# Patient Record
Sex: Female | Born: 1955 | Race: Black or African American | Hispanic: No | Marital: Married | Smoking: Never smoker
Health system: Southern US, Community
[De-identification: ages and names within clinical notes are randomized; demographics above are authoritative.]

## PROBLEM LIST (undated history)

## (undated) DIAGNOSIS — K219 Gastro-esophageal reflux disease without esophagitis: Secondary | ICD-10-CM

## (undated) DIAGNOSIS — E079 Disorder of thyroid, unspecified: Secondary | ICD-10-CM

## (undated) DIAGNOSIS — M1990 Unspecified osteoarthritis, unspecified site: Secondary | ICD-10-CM

## (undated) DIAGNOSIS — I10 Essential (primary) hypertension: Secondary | ICD-10-CM

## (undated) DIAGNOSIS — T7840XA Allergy, unspecified, initial encounter: Secondary | ICD-10-CM

## (undated) DIAGNOSIS — M810 Age-related osteoporosis without current pathological fracture: Secondary | ICD-10-CM

## (undated) DIAGNOSIS — E119 Type 2 diabetes mellitus without complications: Secondary | ICD-10-CM

## (undated) HISTORY — DX: Unspecified osteoarthritis, unspecified site: M19.90

## (undated) HISTORY — PX: CHOLECYSTECTOMY: SHX55

## (undated) HISTORY — DX: Gastro-esophageal reflux disease without esophagitis: K21.9

## (undated) HISTORY — DX: Age-related osteoporosis without current pathological fracture: M81.0

## (undated) HISTORY — DX: Allergy, unspecified, initial encounter: T78.40XA

## (undated) HISTORY — PX: ABDOMINAL HYSTERECTOMY: SHX81

---

## 1996-02-05 HISTORY — PX: PARATHYROIDECTOMY: SHX19

## 2004-01-25 ENCOUNTER — Emergency Department: Payer: Self-pay | Admitting: Internal Medicine

## 2004-09-18 ENCOUNTER — Emergency Department: Payer: Self-pay | Admitting: Emergency Medicine

## 2004-10-26 ENCOUNTER — Emergency Department: Payer: Self-pay | Admitting: Emergency Medicine

## 2005-01-09 ENCOUNTER — Ambulatory Visit: Payer: Self-pay

## 2005-03-24 ENCOUNTER — Other Ambulatory Visit: Payer: Self-pay

## 2005-03-24 ENCOUNTER — Emergency Department: Payer: Self-pay | Admitting: Emergency Medicine

## 2006-03-26 ENCOUNTER — Ambulatory Visit: Payer: Self-pay

## 2006-06-21 ENCOUNTER — Emergency Department: Payer: Self-pay | Admitting: Unknown Physician Specialty

## 2006-08-16 ENCOUNTER — Emergency Department: Payer: Self-pay | Admitting: Unknown Physician Specialty

## 2006-10-02 ENCOUNTER — Ambulatory Visit: Payer: Self-pay | Admitting: Gastroenterology

## 2007-06-02 ENCOUNTER — Ambulatory Visit: Payer: Self-pay

## 2007-07-16 ENCOUNTER — Emergency Department: Payer: Self-pay | Admitting: Emergency Medicine

## 2007-08-13 ENCOUNTER — Emergency Department: Payer: Self-pay | Admitting: Emergency Medicine

## 2008-11-23 ENCOUNTER — Ambulatory Visit: Payer: Self-pay

## 2008-12-02 DIAGNOSIS — I868 Varicose veins of other specified sites: Secondary | ICD-10-CM | POA: Insufficient documentation

## 2009-08-03 ENCOUNTER — Emergency Department: Payer: Self-pay | Admitting: Emergency Medicine

## 2010-01-10 ENCOUNTER — Ambulatory Visit: Payer: Self-pay

## 2011-04-09 ENCOUNTER — Ambulatory Visit: Payer: Self-pay

## 2011-11-20 ENCOUNTER — Ambulatory Visit: Payer: Self-pay | Admitting: Gastroenterology

## 2011-11-22 LAB — PATHOLOGY REPORT

## 2012-08-19 ENCOUNTER — Ambulatory Visit: Payer: Self-pay

## 2013-09-05 ENCOUNTER — Emergency Department: Payer: Self-pay | Admitting: Emergency Medicine

## 2013-10-06 ENCOUNTER — Ambulatory Visit: Payer: Self-pay

## 2013-11-06 ENCOUNTER — Emergency Department: Payer: Self-pay | Admitting: Internal Medicine

## 2014-09-09 ENCOUNTER — Encounter: Payer: Self-pay | Admitting: Emergency Medicine

## 2014-09-09 ENCOUNTER — Other Ambulatory Visit: Payer: Self-pay

## 2014-09-09 ENCOUNTER — Emergency Department: Payer: Medicaid Other

## 2014-09-09 ENCOUNTER — Emergency Department
Admission: EM | Admit: 2014-09-09 | Discharge: 2014-09-09 | Disposition: A | Discharge status: Home / Self Care | Payer: Medicaid Other | Attending: Emergency Medicine | Admitting: Emergency Medicine

## 2014-09-09 DIAGNOSIS — E119 Type 2 diabetes mellitus without complications: Secondary | ICD-10-CM | POA: Insufficient documentation

## 2014-09-09 DIAGNOSIS — R42 Dizziness and giddiness: Secondary | ICD-10-CM

## 2014-09-09 DIAGNOSIS — I10 Essential (primary) hypertension: Secondary | ICD-10-CM | POA: Insufficient documentation

## 2014-09-09 DIAGNOSIS — R110 Nausea: Secondary | ICD-10-CM | POA: Diagnosis not present

## 2014-09-09 DIAGNOSIS — H6122 Impacted cerumen, left ear: Secondary | ICD-10-CM | POA: Diagnosis not present

## 2014-09-09 HISTORY — DX: Type 2 diabetes mellitus without complications: E11.9

## 2014-09-09 HISTORY — DX: Disorder of thyroid, unspecified: E07.9

## 2014-09-09 HISTORY — DX: Essential (primary) hypertension: I10

## 2014-09-09 LAB — CBC WITH DIFFERENTIAL/PLATELET
BASOS ABS: 0 10*3/uL (ref 0–0.1)
Basophils Relative: 1 %
EOS ABS: 0.1 10*3/uL (ref 0–0.7)
Eosinophils Relative: 3 %
HEMATOCRIT: 34.4 % — AB (ref 35.0–47.0)
Hemoglobin: 11 g/dL — ABNORMAL LOW (ref 12.0–16.0)
LYMPHS PCT: 23 %
Lymphs Abs: 1.1 10*3/uL (ref 1.0–3.6)
MCH: 25.8 pg — AB (ref 26.0–34.0)
MCHC: 31.9 g/dL — ABNORMAL LOW (ref 32.0–36.0)
MCV: 80.9 fL (ref 80.0–100.0)
MONOS PCT: 9 %
Monocytes Absolute: 0.4 10*3/uL (ref 0.2–0.9)
Neutro Abs: 3.3 10*3/uL (ref 1.4–6.5)
Neutrophils Relative %: 64 %
Platelets: 158 10*3/uL (ref 150–440)
RBC: 4.25 MIL/uL (ref 3.80–5.20)
RDW: 16.5 % — ABNORMAL HIGH (ref 11.5–14.5)
WBC: 5 10*3/uL (ref 3.6–11.0)

## 2014-09-09 LAB — COMPREHENSIVE METABOLIC PANEL
ALBUMIN: 4.1 g/dL (ref 3.5–5.0)
ALK PHOS: 65 U/L (ref 38–126)
ALT: 15 U/L (ref 14–54)
AST: 20 U/L (ref 15–41)
Anion gap: 9 (ref 5–15)
BILIRUBIN TOTAL: 0.2 mg/dL — AB (ref 0.3–1.2)
BUN: 23 mg/dL — ABNORMAL HIGH (ref 6–20)
CO2: 27 mmol/L (ref 22–32)
Calcium: 9.6 mg/dL (ref 8.9–10.3)
Chloride: 103 mmol/L (ref 101–111)
Creatinine, Ser: 0.8 mg/dL (ref 0.44–1.00)
GFR calc Af Amer: >60 mL/min (ref >60)
GFR calc non Af Amer: >60 mL/min (ref >60)
Glucose, Bld: 126 mg/dL — ABNORMAL HIGH (ref 65–99)
Potassium: 3.8 mmol/L (ref 3.5–5.1)
Sodium: 139 mmol/L (ref 135–145)
Total Protein: 7.9 g/dL (ref 6.5–8.1)

## 2014-09-09 LAB — URINALYSIS COMPLETE WITH MICROSCOPIC (ARMC ONLY)
Bilirubin Urine: NEGATIVE
GLUCOSE, UA: NEGATIVE mg/dL
Hgb urine dipstick: NEGATIVE
Ketones, ur: NEGATIVE mg/dL
Nitrite: NEGATIVE
Protein, ur: NEGATIVE mg/dL
Specific Gravity, Urine: 1.009 (ref 1.005–1.030)
pH: 6 (ref 5.0–8.0)

## 2014-09-09 LAB — TROPONIN I

## 2014-09-09 MED ORDER — ONDANSETRON 4 MG PO TBDP
4.0000 mg | ORAL_TABLET | Freq: Once | ORAL | Status: AC
  Administered 2014-09-09: 4 mg via ORAL
  Filled 2014-09-09: qty 1

## 2014-09-09 MED ORDER — MECLIZINE HCL 25 MG PO TABS
25.0000 mg | ORAL_TABLET | Freq: Once | ORAL | Status: AC
  Administered 2014-09-09: 25 mg via ORAL
  Filled 2014-09-09: qty 1

## 2014-09-09 MED ORDER — SODIUM CHLORIDE 0.9 % IR SOLN: Freq: Once | Status: DC

## 2014-09-09 MED ORDER — HYDROGEN PEROXIDE 3 % EX SOLN
Freq: Once | CUTANEOUS | Status: AC
  Administered 2014-09-09: 11:00:00 via TOPICAL
  Filled 2014-09-09: qty 473

## 2014-09-09 MED ORDER — MECLIZINE HCL 25 MG PO TABS
25.0000 mg | ORAL_TABLET | Freq: Three times a day (TID) | ORAL | Status: AC | PRN
Start: 2014-09-09 — End: 2015-09-09

## 2014-09-09 NOTE — ED Notes (Signed)
Pt c/o dizziness since 0400 today. C/o ears feeling full. No c/o syncope.

## 2014-09-09 NOTE — Discharge Instructions (Signed)

## 2014-09-09 NOTE — ED Provider Notes (Signed)
[facility] Emergency Department Provider Note     Time seen: ----------------------------------------- 7:08 AM on 09/09/2014 -----------------------------------------    I have reviewed the triage vital signs and the nursing notes.   HISTORY  Chief Complaint No chief complaint on file.    HPI Lauren Walton is a 59 y.o. female presents ER for dizziness. By dizziness she means lightheadedness. She states she took a her daughters vertigo medication with mild improvement. She's had slight nausea. Symptoms started at 4:00 this morning. She had this happen years ago, denies fevers chills, recent illness. Denies any change in her medications, she states she's been eating and drinking properly. Denies black stools or tarry stools   Past Medical History  Diagnosis Date  . Diabetes mellitus without complication   . Hypertension   . Thyroid disease     There are no active problems to display for this patient.   Past Surgical History  Procedure Laterality Date  . Abdominal hysterectomy    . Cholecystectomy      Allergies Ace inhibitors  Social History History  Substance Use Topics  . Smoking status: Never Smoker   . Smokeless tobacco: Not on file  . Alcohol Use: No    Review of Systems Constitutional: Negative for fever. Eyes: Negative for visual changes. ENT: Negative for sore throat. Cardiovascular: Negative for chest pain. Respiratory: Negative for shortness of breath. Gastrointestinal: Negative for abdominal pain, positive for nausea Genitourinary: Negative for dysuria. Musculoskeletal: Negative for back pain. Skin: Negative for rash. Neurological: Positive for dizziness  10-point ROS otherwise negative.  ____________________________________________   PHYSICAL EXAM:  VITAL SIGNS: ED Triage Vitals  Enc Vitals Group     BP --      Pulse --      Resp --      Temp --      Temp src --      SpO2 --      Weight --      Height  --      Head Cir --      Peak Flow --      Pain Score --      Pain Loc --      Pain Edu? --      Excl. in GC? --     Constitutional: Alert and oriented. Well appearing and in no distress. Eyes: Conjunctivae are normal. PERRL. Normal extraocular movements. ENT   Head: Normocephalic and atraumatic.   Nose: No congestion/rhinnorhea.   Mouth/Throat: Mucous membranes are moist.   Neck: No stridor.      Ears: There is cerumen impaction on the left Cardiovascular: Normal rate, regular rhythm. Normal and symmetric distal pulses are present in all extremities. No murmurs, rubs, or gallops. Respiratory: Normal respiratory effort without tachypnea nor retractions. Breath sounds are clear and equal bilaterally. No wheezes/rales/rhonchi. Gastrointestinal: Soft and nontender. No distention. No abdominal bruits.  Musculoskeletal: Nontender with normal range of motion in all extremities. No joint effusions.  No lower extremity tenderness nor edema. Neurologic:  Normal speech and language. No gross focal neurologic deficits are appreciated. Speech is normal. No gait instability. Skin:  Skin is warm, dry and intact. No rash noted. Psychiatric: Mood and affect are normal. Speech and behavior are normal. Patient exhibits appropriate insight and judgment. ____________________________________________  EKG: Interpreted by me. Normal sinus rhythm , with normal axis, rate is 72 bpm,  no evidence of acute infarction  ____________________________________________  ED COURSE:  Pertinent labs & imaging results that were available during my care of the patient were reviewed by me and considered in my medical decision making (see chart for details). Patient with nonspecific dizziness, will need general evaluation ____________________________________________     LABS (pertinent positives/negatives)  Labs Reviewed  CBC WITH DIFFERENTIAL/PLATELET - Abnormal; Notable for the following:    Hemoglobin 11.0 (*)    HCT 34.4 (*)    MCH 25.8 (*)    MCHC 31.9 (*)    RDW 16.5 (*)    All other components within normal limits  COMPREHENSIVE METABOLIC PANEL - Abnormal; Notable for the following:    Glucose, Bld 126 (*)    BUN 23 (*)    Total Bilirubin 0.2 (*)    All other components within normal limits  URINALYSIS COMPLETEWITH MICROSCOPIC ([facility] ONLY) - Abnormal; Notable for the following:    Color, Urine STRAW (*)    APPearance CLEAR (*)    Leukocytes, UA 1+ (*)    Bacteria, UA RARE (*)    Squamous Epithelial / LPF 0-5 (*)    All other components within normal limits  TROPONIN I    RADIOLOGY  CT head is unremarkable  ____________________________________________  FINAL ASSESSMENT AND PLAN  Dizziness  Plan: Patient with labs and imaging as dictated above. She is in no acute distress, no clear cause for her lightheadedness that she describes as dizziness. Labs are unremarkable. Symptoms seemed to improve for meclizine. I will prescribe meclizine and have her follow-up with her doctor.   Emily Filbert, MD   Emily Filbert, MD 09/09/14 [phone]

## 2014-09-29 ENCOUNTER — Other Ambulatory Visit: Payer: Self-pay | Admitting: Internal Medicine

## 2014-09-29 DIAGNOSIS — Z1231 Encounter for screening mammogram for malignant neoplasm of breast: Secondary | ICD-10-CM

## 2014-10-11 ENCOUNTER — Ambulatory Visit
Admission: RE | Admit: 2014-10-11 | Discharge: 2014-10-11 | Disposition: A | Discharge status: Home / Self Care | Payer: Medicaid Other | Source: Ambulatory Visit | Attending: Internal Medicine | Admitting: Internal Medicine

## 2014-10-11 DIAGNOSIS — R928 Other abnormal and inconclusive findings on diagnostic imaging of breast: Secondary | ICD-10-CM | POA: Diagnosis not present

## 2014-10-11 DIAGNOSIS — Z1231 Encounter for screening mammogram for malignant neoplasm of breast: Secondary | ICD-10-CM

## 2014-10-13 ENCOUNTER — Ambulatory Visit
Admission: RE | Admit: 2014-10-13 | Discharge: 2014-10-13 | Disposition: A | Discharge status: Home / Self Care | Payer: Medicaid Other | Source: Ambulatory Visit | Attending: Internal Medicine | Admitting: Internal Medicine

## 2014-10-13 ENCOUNTER — Other Ambulatory Visit: Payer: Self-pay | Admitting: Internal Medicine

## 2014-10-13 DIAGNOSIS — N63 Unspecified lump in unspecified breast: Secondary | ICD-10-CM

## 2015-04-05 ENCOUNTER — Emergency Department: Payer: Medicaid Other

## 2015-04-05 ENCOUNTER — Emergency Department
Admission: EM | Admit: 2015-04-05 | Discharge: 2015-04-06 | Disposition: A | Discharge status: Home / Self Care | Payer: Medicaid Other | Attending: Emergency Medicine | Admitting: Emergency Medicine

## 2015-04-05 DIAGNOSIS — I10 Essential (primary) hypertension: Secondary | ICD-10-CM | POA: Insufficient documentation

## 2015-04-05 DIAGNOSIS — R002 Palpitations: Secondary | ICD-10-CM | POA: Diagnosis present

## 2015-04-05 DIAGNOSIS — E119 Type 2 diabetes mellitus without complications: Secondary | ICD-10-CM | POA: Diagnosis not present

## 2015-04-05 DIAGNOSIS — Z7982 Long term (current) use of aspirin: Secondary | ICD-10-CM | POA: Diagnosis not present

## 2015-04-05 DIAGNOSIS — K5900 Constipation, unspecified: Secondary | ICD-10-CM | POA: Diagnosis not present

## 2015-04-05 DIAGNOSIS — Z79899 Other long term (current) drug therapy: Secondary | ICD-10-CM | POA: Insufficient documentation

## 2015-04-05 DIAGNOSIS — Z7984 Long term (current) use of oral hypoglycemic drugs: Secondary | ICD-10-CM | POA: Diagnosis not present

## 2015-04-05 LAB — COMPREHENSIVE METABOLIC PANEL
ALT: 14 U/L (ref 14–54)
AST: 20 U/L (ref 15–41)
Albumin: 4.1 g/dL (ref 3.5–5.0)
Alkaline Phosphatase: 69 U/L (ref 38–126)
Anion gap: 8 (ref 5–15)
BUN: 25 mg/dL — AB (ref 6–20)
CHLORIDE: 105 mmol/L (ref 101–111)
CO2: 26 mmol/L (ref 22–32)
CREATININE: 0.91 mg/dL (ref 0.44–1.00)
Calcium: 9.7 mg/dL (ref 8.9–10.3)
GLUCOSE: 116 mg/dL — AB (ref 65–99)
POTASSIUM: 3.8 mmol/L (ref 3.5–5.1)
Sodium: 139 mmol/L (ref 135–145)
TOTAL PROTEIN: 8 g/dL (ref 6.5–8.1)
Total Bilirubin: 0.4 mg/dL (ref 0.3–1.2)

## 2015-04-05 LAB — CBC
HCT: 33.5 % — ABNORMAL LOW (ref 35.0–47.0)
Hemoglobin: 10.8 g/dL — ABNORMAL LOW (ref 12.0–16.0)
MCH: 25.9 pg — AB (ref 26.0–34.0)
MCHC: 32.3 g/dL (ref 32.0–36.0)
MCV: 80.1 fL (ref 80.0–100.0)
PLATELETS: 194 10*3/uL (ref 150–440)
RBC: 4.19 MIL/uL (ref 3.80–5.20)
RDW: 16.4 % — AB (ref 11.5–14.5)
WBC: 6.9 10*3/uL (ref 3.6–11.0)

## 2015-04-05 LAB — TROPONIN I

## 2015-04-05 NOTE — ED Notes (Signed)
Pt to Xray.

## 2015-04-05 NOTE — ED Notes (Signed)
Onset yesterday of palpitations after sitting on the toilet (constipation). Patient denies CP, respiratory distress or shortness of breath, denies N/V or diaphoresis. Able to speak without difficulty. States when she walks it goes away but when she is at rest it starts again. States it is just every once in a while not all the time. Skin warm/dry, color normal for ethnicity.

## 2015-04-05 NOTE — Discharge Instructions (Signed)
Cardiac Event Monitoring °A cardiac event monitor is a small recording device used to help detect abnormal heart rhythms (arrhythmias). The monitor is used to record heart rhythm when noticeable symptoms such as the following occur: °· Fast heartbeats (palpitations), such as heart racing or fluttering. °· Dizziness. °· Fainting or light-headedness. °· Unexplained weakness. °The monitor is wired to two electrodes placed on your chest. Electrodes are flat, sticky disks that attach to your skin. The monitor can be worn for up to 30 days. You will wear the monitor at all times, except when bathing.  °HOW TO USE YOUR CARDIAC EVENT MONITOR °A technician will prepare your chest for the electrode placement. The technician will show you how to place the electrodes, how to work the monitor, and how to replace the batteries. Take time to practice using the monitor before you leave the office. Make sure you understand how to send the information from the monitor to your health care provider. This requires a telephone with a landline, not a cell phone. You need to: °· Wear your monitor at all times, except when you are in water: °· Do not get the monitor wet. °· Take the monitor off when bathing. Do not swim or use a hot tub with it on. °· Keep your skin clean. Do not put body lotion or moisturizer on your chest. °· Change the electrodes daily or any time they stop sticking to your skin. You might need to use tape to keep them on. °· It is possible that your skin under the electrodes could become irritated. To keep this from happening, try to put the electrodes in slightly different places on your chest. However, they must remain in the area under your left breast and in the upper right section of your chest. °· Make sure the monitor is safely clipped to your clothing or in a location close to your body that your health care provider recommends. °· Press the button to record when you feel symptoms of heart trouble, such as  dizziness, weakness, light-headedness, palpitations, thumping, shortness of breath, unexplained weakness, or a fluttering or racing heart. The monitor is always on and records what happened slightly before you pressed the button, so do not worry about being too late to get good information. °· Keep a diary of your activities, such as walking, doing chores, and taking medicine. It is especially important to note what you were doing when you pushed the button to record your symptoms. This will help your health care provider determine what might be contributing to your symptoms. The information stored in your monitor will be reviewed by your health care provider alongside your diary entries. °· Send the recorded information as recommended by your health care provider. It is important to understand that it will take some time for your health care provider to process the results. °· Change the batteries as recommended by your health care provider. °SEEK IMMEDIATE MEDICAL CARE IF:  °· You have chest pain. °· You have extreme difficulty breathing or shortness of breath. °· You develop a very fast heartbeat that persists. °· You develop dizziness that does not go away. °· You faint or constantly feel you are about to faint. °  °This information is not intended to replace advice given to you by your health care provider. Make sure you discuss any questions you have with your health care provider. °  °Document Released: 10/31/2007 Document Revised: 02/11/2014 Document Reviewed: 07/20/2012 °Elsevier Interactive Patient Education ©2016 [employer] ° °  Constipation, Adult Constipation is when a person:  Poops (has a bowel movement) less than 3 times a week.  Has a hard time pooping.  Has poop that is dry, hard, or bigger than normal. HOME CARE   Eat foods with a lot of fiber in them. This includes fruits, vegetables, beans, and whole grains such as brown rice.  Avoid fatty foods and foods with a lot of sugar. This  includes french fries, hamburgers, cookies, candy, and soda.  If you are not getting enough fiber from food, take products with added fiber in them (supplements).  Drink enough fluid to keep your pee (urine) clear or pale yellow.  Exercise on a regular basis, or as told by your doctor.  Go to the restroom when you feel like you need to poop. Do not hold it.  Only take medicine as told by your doctor. Do not take medicines that help you poop (laxatives) without talking to your doctor first. GET HELP RIGHT AWAY IF:   You have bright red blood in your poop (stool).  Your constipation lasts more than 4 days or gets worse.  You have belly (abdominal) or butt (rectal) pain.  You have thin poop (as thin as a pencil).  You lose weight, and it cannot be explained. MAKE SURE YOU:   Understand these instructions.  Will watch your condition.  Will get help right away if you are not doing well or get worse.   This information is not intended to replace advice given to you by your health care provider. Make sure you discuss any questions you have with your health care provider.   Document Released: 07/10/2007 Document Revised: 02/11/2014 Document Reviewed: 11/02/2012 Elsevier Interactive Patient Education 2016 [employer].  Palpitations A palpitation is the feeling that your heartbeat is irregular or is faster than normal. It may feel like your heart is fluttering or skipping a beat. Palpitations are usually not a serious problem. However, in some cases, you may need further medical evaluation. CAUSES  Palpitations can be caused by:  Smoking.  Caffeine or other stimulants, such as diet pills or energy drinks.  Alcohol.  Stress and anxiety.  Strenuous physical activity.  Fatigue.  Certain medicines.  Heart disease, especially if you have a history of irregular heart rhythms (arrhythmias), such as atrial fibrillation, atrial flutter, or supraventricular tachycardia.  An  improperly working pacemaker or defibrillator. DIAGNOSIS  To find the cause of your palpitations, your health care provider will take your medical history and perform a physical exam. Your health care provider may also have you take a test called an ambulatory electrocardiogram (ECG). An ECG records your heartbeat patterns over a 24-hour period. You may also have other tests, such as:  Transthoracic echocardiogram (TTE). During echocardiography, sound waves are used to evaluate how blood flows through your heart.  Transesophageal echocardiogram (TEE).  Cardiac monitoring. This allows your health care provider to monitor your heart rate and rhythm in real time.  Holter monitor. This is a portable device that records your heartbeat and can help diagnose heart arrhythmias. It allows your health care provider to track your heart activity for several days, if needed.  Stress tests by exercise or by giving medicine that makes the heart beat faster. TREATMENT  Treatment of palpitations depends on the cause of your symptoms and can vary greatly. Most cases of palpitations do not require any treatment other than time, relaxation, and monitoring your symptoms. Other causes, such as atrial fibrillation, atrial flutter, or  supraventricular tachycardia, usually require further treatment. HOME CARE INSTRUCTIONS   Avoid:  Caffeinated coffee, tea, soft drinks, diet pills, and energy drinks.  Chocolate.  Alcohol.  Stop smoking if you smoke.  Reduce your stress and anxiety. Things that can help you relax include:  A method of controlling things in your body, such as your heartbeats, with your mind (biofeedback).  Yoga.  Meditation.  Physical activity such as swimming, jogging, or walking.  Get plenty of rest and sleep. SEEK MEDICAL CARE IF:   You continue to have a fast or irregular heartbeat beyond 24 hours.  Your palpitations occur more often. SEEK IMMEDIATE MEDICAL CARE IF:  You have  chest pain or shortness of breath.  You have a severe headache.  You feel dizzy or you faint. MAKE SURE YOU:  Understand these instructions.  Will watch your condition.  Will get help right away if you are not doing well or get worse.   This information is not intended to replace advice given to you by your health care provider. Make sure you discuss any questions you have with your health care provider.   Document Released: 01/19/2000 Document Revised: 01/26/2013 Document Reviewed: 03/22/2011 Elsevier Interactive Patient Education [employer]   Please return immediately if condition worsens. Please contact her primary physician or the physician you were given for referral. If you have any specialist physicians involved in her treatment and plan please also contact them. Thank you for using [facility]. Consider magnesium citrate for constipation. Discussed with your primary physician and event monitor or Holter monitor.

## 2015-04-06 NOTE — ED Provider Notes (Signed)
Time Seen: Approximately 2320 I have reviewed the triage notes  Chief Complaint: Palpitations   History of Present Illness: Lauren Walton is a 60 y.o. female *who presents with feelings of heart palpitations after straining on the toilet because of a history of constipation. States she's been constipated now for the last 2 weeks and is noticed some intermittent heart palpitations generally when she is straining to have a bowel movement. She states he seemed to be worse tonight, hence her arrival here to emergency department. She denies any associated chest pain, shortness of breath, diaphoresis, syncope or feelings of lightheadedness or any other new concerns. She denies any melena or hematochezia with her bowel movements. She states she has had a colonoscopy in the past and is scheduled for one next year. He denies any weight loss or night sweats. She states her stools are generally small and hard in the last one was this morning. Patient states she was just recently seen by her primary physician for the constipation but did not mention the heart palpitations.   Past Medical History  Diagnosis Date  . Diabetes mellitus without complication (HCC)   . Hypertension   . Thyroid disease     There are no active problems to display for this patient.   Past Surgical History  Procedure Laterality Date  . Abdominal hysterectomy    . Cholecystectomy      Past Surgical History  Procedure Laterality Date  . Abdominal hysterectomy    . Cholecystectomy      Current Outpatient Rx  Name  Route  Sig  Dispense  Refill  . acetaminophen (TYLENOL) 650 MG CR tablet   Oral   Take 650 mg by mouth every 8 (eight) hours as needed for pain.         Marland Kitchen aspirin EC 81 MG tablet   Oral   Take 81 mg by mouth daily.         . hydrochlorothiazide (HYDRODIURIL) 25 MG tablet   Oral   Take 25 mg by mouth daily.         Marland Kitchen levothyroxine (SYNTHROID, LEVOTHROID) 175 MCG tablet   Oral   Take 175 mcg by  mouth daily before breakfast.         . losartan (COZAAR) 25 MG tablet   Oral   Take 25 mg by mouth daily.         . meclizine (ANTIVERT) 25 MG tablet   Oral   Take 1 tablet (25 mg total) by mouth 3 (three) times daily as needed for dizziness or nausea.   30 tablet   1   . metFORMIN (GLUCOPHAGE) 500 MG tablet   Oral   Take by mouth 2 (two) times daily with a meal.         . metoprolol tartrate (LOPRESSOR) 25 MG tablet   Oral   Take 25 mg by mouth 2 (two) times daily.         . simvastatin (ZOCOR) 20 MG tablet   Oral   Take 20 mg by mouth daily.         . Vitamin D, Ergocalciferol, (DRISDOL) 50000 UNITS CAPS capsule   Oral   Take 50,000 Units by mouth every 7 (seven) days.           Allergies:  Ace inhibitors  Family History: No family history on file.  Social History: Social History  Substance Use Topics  . Smoking status: Never Smoker   . Smokeless tobacco:  None  . Alcohol Use: No     Review of Systems:   10 point review of systems was performed and was otherwise negative:  Constitutional: No fever Eyes: No visual disturbances ENT: No sore throat, ear pain Cardiac: No chest pain Respiratory: No shortness of breath, wheezing, or stridor Abdomen: No abdominal pain, no vomiting, No diarrhea Endocrine: No weight loss, No night sweats Extremities: No peripheral edema, cyanosis Skin: No rashes, easy bruising Neurologic: No focal weakness, trouble with speech or swollowing Urologic: No dysuria, Hematuria, or urinary frequency   Physical Exam:  ED Triage Vitals  Enc Vitals Group     BP 04/05/15 2208 131/53 mmHg     Pulse Rate 04/05/15 2208 86     Resp 04/05/15 2208 18     Temp 04/05/15 2208 97.6 F (36.4 C)     Temp Source 04/05/15 2208 Oral     SpO2 04/05/15 2208 99 %     Weight 04/05/15 2208 276 lb (125.193 kg)     Height 04/05/15 2208 5\' 6"  (1.676 m)     Head Cir --      Peak Flow --      Pain Score 04/05/15 2206 0     Pain Loc --       Pain Edu? --      Excl. in GC? --     General: Awake , Alert , and Oriented times 3; GCS 15 Head: Normal cephalic , atraumatic Eyes: Pupils equal , round, reactive to light Nose/Throat: No nasal drainage, patent upper airway without erythema or exudate.  Neck: Supple, Full range of motion, No anterior adenopathy or palpable thyroid masses Lungs: Clear to ascultation without wheezes , rhonchi, or rales Heart: Regular rate, regular rhythm without murmurs , gallops , or rubs Abdomen: Obese, Soft, non tender without rebound, guarding , or rigidity; bowel sounds positive and symmetric in all 4 quadrants. No organomegaly .        Extremities: 2 plus symmetric pulses. No edema, clubbing or cyanosis Neurologic: normal ambulation, Motor symmetric without deficits, sensory intact Skin: warm, dry, no rashes   Labs:   All laboratory work was reviewed including any pertinent negatives or positives listed below:  Labs Reviewed  CBC - Abnormal; Notable for the following:    Hemoglobin 10.8 (*)    HCT 33.5 (*)    MCH 25.9 (*)    RDW 16.4 (*)    All other components within normal limits  COMPREHENSIVE METABOLIC PANEL - Abnormal; Notable for the following:    Glucose, Bld 116 (*)    BUN 25 (*)    All other components within normal limits  TROPONIN I   review laboratory work shows no significant findings  EKG: * ED ECG REPORT I, Jennye Moccasin, the attending physician, personally viewed and interpreted this ECG.  Date: 04/06/2015 EKG Time: 2207 Rate: *84 Rhythm: normal sinus rhythm QRS Axis: Low voltage QRS Intervals: normal ST/T Wave abnormalities: normal Conduction Disturbances: none Narrative Interpretation: unremarkable Poor R-wave progression in the anterior leads No ectopy or ischemic changes  Radiology:  CLINICAL DATA: Onset yesterday of palpitations after sitting on the toilet (constipation). Patient denies CP, respiratory distress or shortness of breath, denies N/V  or diaphoresis. Able to speak without difficulty. States when she walks it goes away but when she is at rest it starts again. States it is just every once in a while not all the time. Hx of HTN  EXAM: CHEST 2 VIEW  COMPARISON:  10/27/2004  FINDINGS: The heart size and mediastinal contours are within normal limits. Both lungs are clear. No pleural effusion or pneumothorax. The visualized skeletal structures are unremarkable.  IMPRESSION: No active cardiopulmonary disease.   Electronically Signed By: Amie Portland M.D. On: 04/05/2015 22:35   I personally reviewed the radiologic studies     ED Course:  Patient's stay here was uneventful and no ectopy was noted on a cardiac monitor. She may be slightly dehydrated and she was advised drink plenty of fluids and was given instructions on constipation. I felt her palpitations were not secondary to a life-threatening arrhythmia and advised her to follow up with her primary physician for possible outpatient cardiac monitoring. She was advised to continue with instructions on constipation with milk of magnesia or magnesium citrate. She was advised to return here especially if she has associated chest pain, shortness of breath, diaphoresis, near syncope or any other new concerns.  Assessment:  Constipation Palpitations   Final Clinical Impression:   Final diagnoses:  Palpitations  Constipation, unspecified constipation type     Plan:  Outpatient management Patient was advised to return immediately if condition worsens. Patient was advised to follow up with their primary care physician or other specialized physicians involved in their outpatient care            Jennye Moccasin, MD 04/06/15 0011

## 2015-04-07 ENCOUNTER — Other Ambulatory Visit: Payer: Self-pay | Admitting: *Deleted

## 2015-04-07 DIAGNOSIS — R002 Palpitations: Secondary | ICD-10-CM

## 2015-04-14 ENCOUNTER — Encounter (INDEPENDENT_AMBULATORY_CARE_PROVIDER_SITE_OTHER): Payer: Medicaid Other

## 2015-04-14 DIAGNOSIS — R002 Palpitations: Secondary | ICD-10-CM

## 2015-04-20 ENCOUNTER — Other Ambulatory Visit: Payer: Self-pay | Admitting: Cardiovascular Disease

## 2015-04-20 ENCOUNTER — Ambulatory Visit
Admission: RE | Admit: 2015-04-20 | Discharge: 2015-04-20 | Disposition: A | Discharge status: Home / Self Care | Payer: Medicaid Other | Source: Ambulatory Visit | Attending: Cardiovascular Disease | Admitting: Cardiovascular Disease

## 2015-04-20 DIAGNOSIS — R002 Palpitations: Secondary | ICD-10-CM

## 2015-11-06 ENCOUNTER — Emergency Department
Admission: EM | Admit: 2015-11-06 | Discharge: 2015-11-06 | Disposition: A | Discharge status: Home / Self Care | Payer: Medicaid Other | Attending: Emergency Medicine | Admitting: Emergency Medicine

## 2015-11-06 ENCOUNTER — Encounter: Payer: Self-pay | Admitting: *Deleted

## 2015-11-06 DIAGNOSIS — K648 Other hemorrhoids: Secondary | ICD-10-CM | POA: Insufficient documentation

## 2015-11-06 DIAGNOSIS — Z7984 Long term (current) use of oral hypoglycemic drugs: Secondary | ICD-10-CM | POA: Insufficient documentation

## 2015-11-06 DIAGNOSIS — E119 Type 2 diabetes mellitus without complications: Secondary | ICD-10-CM | POA: Diagnosis not present

## 2015-11-06 DIAGNOSIS — Z791 Long term (current) use of non-steroidal anti-inflammatories (NSAID): Secondary | ICD-10-CM | POA: Insufficient documentation

## 2015-11-06 DIAGNOSIS — K5900 Constipation, unspecified: Secondary | ICD-10-CM | POA: Diagnosis not present

## 2015-11-06 DIAGNOSIS — Z7982 Long term (current) use of aspirin: Secondary | ICD-10-CM | POA: Insufficient documentation

## 2015-11-06 DIAGNOSIS — Z79899 Other long term (current) drug therapy: Secondary | ICD-10-CM | POA: Diagnosis not present

## 2015-11-06 DIAGNOSIS — I10 Essential (primary) hypertension: Secondary | ICD-10-CM | POA: Insufficient documentation

## 2015-11-06 DIAGNOSIS — K625 Hemorrhage of anus and rectum: Secondary | ICD-10-CM | POA: Insufficient documentation

## 2015-11-06 LAB — COMPREHENSIVE METABOLIC PANEL WITH GFR
ALT: 13 U/L — ABNORMAL LOW (ref 14–54)
AST: 19 U/L (ref 15–41)
Albumin: 4.1 g/dL (ref 3.5–5.0)
Alkaline Phosphatase: 64 U/L (ref 38–126)
Anion gap: 6 (ref 5–15)
BUN: 18 mg/dL (ref 6–20)
CO2: 26 mmol/L (ref 22–32)
Calcium: 9.9 mg/dL (ref 8.9–10.3)
Chloride: 108 mmol/L (ref 101–111)
Creatinine, Ser: 0.93 mg/dL (ref 0.44–1.00)
GFR calc Af Amer: >60 mL/min
GFR calc non Af Amer: >60 mL/min
Glucose, Bld: 144 mg/dL — ABNORMAL HIGH (ref 65–99)
Potassium: 3.8 mmol/L (ref 3.5–5.1)
Sodium: 140 mmol/L (ref 135–145)
Total Bilirubin: 0.4 mg/dL (ref 0.3–1.2)
Total Protein: 8 g/dL (ref 6.5–8.1)

## 2015-11-06 LAB — CBC
HCT: 34.8 % — ABNORMAL LOW (ref 35.0–47.0)
Hemoglobin: 11.4 g/dL — ABNORMAL LOW (ref 12.0–16.0)
MCH: 26 pg (ref 26.0–34.0)
MCHC: 32.6 g/dL (ref 32.0–36.0)
MCV: 79.6 fL — ABNORMAL LOW (ref 80.0–100.0)
Platelets: 169 10*3/uL (ref 150–440)
RBC: 4.37 MIL/uL (ref 3.80–5.20)
RDW: 17.3 % — AB (ref 11.5–14.5)
WBC: 5.6 10*3/uL (ref 3.6–11.0)

## 2015-11-06 NOTE — Discharge Instructions (Signed)
Please take a high-fiber diet and get plenty of exercise to keep your intestines moving. You may take a daily stool softener to help with your bowels. Please talk to your regular doctor about the bleeding is you saw in your stool today. Return to the emergency department for increased bleeding, lightheadedness, shortness of breath, chest pain, fainting, fever, or any other symptoms concerning to you.

## 2015-11-06 NOTE — ED Provider Notes (Addendum)
[facility] Emergency Department Provider Note  ____________________________________________  Time seen: Approximately 9:17 PM  I have reviewed the triage vital signs and the nursing notes.   HISTORY  Chief Complaint Rectal Bleeding    HPI Lauren Walton is a 60 y.o. female with morbid obesity, history of low fiber diet, chronic constipation and hard stool, presenting with blood in her stool 1 today. The patient reports that last week, she was having hard stools that required a significant amount of bearing down. Over the weekend, she initiated prune juice, and the stools soft and somewhat. Today, she had a single episode of painless blood streaked poop. No associated rectal pain, fever, nausea or vomiting, lightheadedness, shortness of breath, syncope.   Past Medical History:  Diagnosis Date  . Diabetes mellitus without complication (HCC)   . Hypertension   . Thyroid disease     There are no active problems to display for this patient.   Past Surgical History:  Procedure Laterality Date  . ABDOMINAL HYSTERECTOMY    . CHOLECYSTECTOMY      Current Outpatient Rx  . Order #: [id number] Class: Historical Med  . Order #: [id number] Class: Historical Med  . Order #: [id number] Class: Historical Med  . Order #: [id number] Class: Historical Med  . Order #: [id number] Class: Historical Med  . Order #: [id number] Class: Historical Med  . Order #: [id number] Class: Historical Med  . Order #: [id number] Class: Historical Med  . Order #: [id number] Class: Historical Med    Allergies Ace inhibitors  No family history on file.  Social History Social History  Substance Use Topics  . Smoking status: Never Smoker  . Smokeless tobacco: Never Used  . Alcohol use No    Review of Systems Constitutional: No fever/chills. Eyes: No visual changes. ENT: No sore throat. No congestion or rhinorrhea. Cardiovascular: Denies chest pain. Denies palpitations. Respiratory:  Denies shortness of breath.  No cough. Gastrointestinal: No abdominal pain.  No nausea, no vomiting.  No diarrhea.  Positive constipation. Positive for blood on stool. Genitourinary: Negative for dysuria. Musculoskeletal: Negative for back pain. Skin: Negative for rash. Neurological: Negative for headaches. No focal numbness, tingling or weakness.   10-point ROS otherwise negative.  ____________________________________________   PHYSICAL EXAM:  VITAL SIGNS: ED Triage Vitals  Enc Vitals Group     BP 11/06/15 1721 123/76     Pulse Rate 11/06/15 1721 83     Resp 11/06/15 1721 16     Temp 11/06/15 1721 98.4 F (36.9 C)     Temp Source 11/06/15 1721 Oral     SpO2 11/06/15 1721 100 %     Weight 11/06/15 1721 280 lb (127 kg)     Height 11/06/15 1721 5\' 7"  (1.702 m)     Head Circumference --      Peak Flow --      Pain Score 11/06/15 1735 2     Pain Loc --      Pain Edu? --      Excl. in GC? --     Constitutional: Alert and oriented. Well appearing and in no acute distress. Answers questions appropriately. Eyes: Conjunctivae are normalAnd without pallor.  EOMI. No scleral icterus. Head: Atraumatic. Nose: No congestion/rhinnorhea. Mouth/Throat: Mucous membranes are moist.  Neck: No stridor.  Supple.   Cardiovascular: Normal rate, regular rhythm. No murmurs, rubs or gallops.  Respiratory: Normal respiratory effort.  No accessory muscle use or retractions. Lungs CTAB.  No wheezes, rales or ronchi. Gastrointestinal: Obese. Soft, nontender and nondistended.  No guarding or rebound.  No peritoneal signs. Genitourinary: No evidence of external hemorrhoids. Small palpable internal hemorrhoids. Stool is brown and guaiac-negative. Musculoskeletal: No LE edema.  Neurologic:  A&Ox3.  Speech is clear.  Face and smile are symmetric.  EOMI.  Moves all extremities well. Skin:  Skin is warm, dry and intact. No rash noted. Psychiatric: Mood and affect are normal. Speech and behavior are normal.   Normal judgement.  ____________________________________________   LABS (all labs ordered are listed, but only abnormal results are displayed)  Labs Reviewed  COMPREHENSIVE METABOLIC PANEL - Abnormal; Notable for the following:       Result Value   Glucose, Bld 144 (*)    ALT 13 (*)    All other components within normal limits  CBC - Abnormal; Notable for the following:    Hemoglobin 11.4 (*)    HCT 34.8 (*)    MCV 79.6 (*)    RDW 17.3 (*)    All other components within normal limits   ____________________________________________  EKG  Not indicated ____________________________________________  RADIOLOGY  No results found.  ____________________________________________   PROCEDURES  Procedure(s) performed: None  Procedures  Critical Care performed: No ____________________________________________   INITIAL IMPRESSION / ASSESSMENT AND PLAN / ED COURSE  Pertinent labs & imaging results that were available during my care of the patient were reviewed by me and considered in my medical decision making (see chart for details).  60 y.o. female with a history of constipation and low fiber diet, hard stools, presenting with a single episode of blood streaks on her stool earlier today. At this time, there is no evidence of blood in her stool. She is hemodynamically stable and has a chronic anemia that is unchanged and stable. Most likely, the patient had an episode related to internal hemorrhoids, or possibly a small fissure that is not noticeable on my examination, given her history of hard stools. I talked to the patient about a high-fiber diet, stool softener as needed. She will follow up with her primary care physician. She understands return precautions as well as follow-up instructions.  ____________________________________________  FINAL CLINICAL IMPRESSION(S) / ED DIAGNOSES  Final diagnoses:  Rectal bleeding  Constipation, unspecified constipation type     Clinical Course      NEW MEDICATIONS STARTED DURING THIS VISIT:  New Prescriptions   No medications on file      Rockne Menghini, MD 11/06/15 2120    Rockne Menghini, MD 11/06/15 2120

## 2015-11-06 NOTE — ED Notes (Signed)
Pt c/o blood in her stool during her last BM tonight. Describes blood being bright red. Denies any abd pain or blood since. Pt states she was constipated last week and has been drinking prune juice. Denies any painful BM. Pt in NAd at this time

## 2015-11-06 NOTE — ED Triage Notes (Addendum)
Pt reports blood in stool today x 1.  Constipation last week.  No abd pain.  No dizziness.  No chest pain.  No sob  No vomiting.  Pt alert.

## 2016-03-07 DIAGNOSIS — Z1239 Encounter for other screening for malignant neoplasm of breast: Secondary | ICD-10-CM | POA: Insufficient documentation

## 2016-03-07 DIAGNOSIS — E039 Hypothyroidism, unspecified: Secondary | ICD-10-CM | POA: Insufficient documentation

## 2016-03-07 DIAGNOSIS — E119 Type 2 diabetes mellitus without complications: Secondary | ICD-10-CM | POA: Insufficient documentation

## 2016-03-07 DIAGNOSIS — D696 Thrombocytopenia, unspecified: Secondary | ICD-10-CM | POA: Insufficient documentation

## 2016-03-24 ENCOUNTER — Encounter: Payer: Self-pay | Admitting: Emergency Medicine

## 2016-03-24 DIAGNOSIS — Z7984 Long term (current) use of oral hypoglycemic drugs: Secondary | ICD-10-CM | POA: Insufficient documentation

## 2016-03-24 DIAGNOSIS — E119 Type 2 diabetes mellitus without complications: Secondary | ICD-10-CM | POA: Insufficient documentation

## 2016-03-24 DIAGNOSIS — Z79899 Other long term (current) drug therapy: Secondary | ICD-10-CM | POA: Diagnosis not present

## 2016-03-24 DIAGNOSIS — I10 Essential (primary) hypertension: Secondary | ICD-10-CM | POA: Diagnosis not present

## 2016-03-24 DIAGNOSIS — Z7982 Long term (current) use of aspirin: Secondary | ICD-10-CM | POA: Insufficient documentation

## 2016-03-24 DIAGNOSIS — M542 Cervicalgia: Secondary | ICD-10-CM | POA: Diagnosis present

## 2016-03-24 DIAGNOSIS — M62838 Other muscle spasm: Secondary | ICD-10-CM | POA: Diagnosis not present

## 2016-03-24 NOTE — ED Triage Notes (Signed)
Pt reports right sided neck pain since last Sunday that radiates down across the top of her right shoulder; pain increases with movement; denies injury; pt says similar episode about 6-7 years ago and was told it was "tension"; pt in no acute distress

## 2016-03-25 ENCOUNTER — Emergency Department
Admission: EM | Admit: 2016-03-25 | Discharge: 2016-03-25 | Disposition: A | Discharge status: Home / Self Care | Payer: Medicaid Other | Attending: Emergency Medicine | Admitting: Emergency Medicine

## 2016-03-25 DIAGNOSIS — M62838 Other muscle spasm: Secondary | ICD-10-CM

## 2016-03-25 MED ORDER — CYCLOBENZAPRINE HCL 5 MG PO TABS: 5.0000 mg | ORAL_TABLET | Freq: Every day | ORAL | 0 refills | Status: DC

## 2016-03-25 MED ORDER — CYCLOBENZAPRINE HCL 10 MG PO TABS
5.0000 mg | ORAL_TABLET | Freq: Once | ORAL | Status: AC
  Administered 2016-03-25: 5 mg via ORAL
  Filled 2016-03-25: qty 1

## 2016-03-25 MED ORDER — KETOROLAC TROMETHAMINE 30 MG/ML IJ SOLN
30.0000 mg | Freq: Once | INTRAMUSCULAR | Status: AC
  Administered 2016-03-25: 30 mg via INTRAMUSCULAR
  Filled 2016-03-25: qty 1

## 2016-03-25 MED ORDER — IBUPROFEN 800 MG PO TABS: 800.0000 mg | ORAL_TABLET | Freq: Three times a day (TID) | ORAL | 0 refills | Status: DC | PRN

## 2016-03-25 NOTE — Discharge Instructions (Signed)
1. You may take medicines as needed for pain and muscle spasms (Motrin/Flexeril #15). 2. Apply moist heat to affected area several times daily. 3. Return to the ER for worsening symptoms, persistent vomiting, extremity weakness or other concerns.

## 2016-03-25 NOTE — ED Provider Notes (Signed)
[facility] Emergency Department Provider Note   ____________________________________________   First MD Initiated Contact with Patient 03/25/16 [phone]     (approximate)  I have reviewed the triage vital signs and the nursing notes.   HISTORY  Chief Complaint Neck Pain    HPI Lauren Walton is a 61 y.o. female who presents to the ED from home with a chief complaint of right-sided neck pain. Patient reports a one-week history of nontraumatic right neck pain. Describes sharp pain which radiates across her neck to the top of her right shoulder. Symptoms increases with movement. Denies numbness/tingling/extremity weakness. Reports similar symptoms previously and diagnosed with "tension". Denies recent fever, chills, chest pain, shortness breath, abdominal pain, nausea, vomiting, diarrhea. Denies recent travel or trauma. Nothing makes her symptoms better.   Past Medical History:  Diagnosis Date  . Diabetes mellitus without complication (HCC)   . Hypertension   . Thyroid disease     There are no active problems to display for this patient.   Past Surgical History:  Procedure Laterality Date  . ABDOMINAL HYSTERECTOMY    . CHOLECYSTECTOMY      Prior to Admission medications   Medication Sig Start Date End Date Taking? Authorizing Provider  acetaminophen (TYLENOL) 650 MG CR tablet Take 650 mg by mouth every 8 (eight) hours as needed for pain.    Historical Provider, MD  aspirin EC 81 MG tablet Take 81 mg by mouth daily.    Historical Provider, MD  cyclobenzaprine (FLEXERIL) 5 MG tablet Take 1 tablet (5 mg total) by mouth daily. 03/25/16   Irean HongJade J Celine Dishman, MD  diphenhydrAMINE (BENADRYL) 25 MG tablet Take 25 mg by mouth every 6 (six) hours as needed.    Historical Provider, MD  hydrochlorothiazide (HYDRODIURIL) 25 MG tablet Take 25 mg by mouth daily.    Historical Provider, MD  ibuprofen (ADVIL,MOTRIN) 800 MG tablet Take 1 tablet (800 mg total) by mouth every 8  (eight) hours as needed for moderate pain. 03/25/16   Irean HongJade J Elanor Cale, MD  Levothyroxine Sodium 150 MCG CAPS Take 150 mcg by mouth daily before breakfast.     Historical Provider, MD  losartan (COZAAR) 25 MG tablet Take 25 mg by mouth daily.    Historical Provider, MD  metFORMIN (GLUCOPHAGE) 500 MG tablet Take by mouth 2 (two) times daily with a meal.    Historical Provider, MD  simvastatin (ZOCOR) 20 MG tablet Take 20 mg by mouth daily.    Historical Provider, MD  Vitamin D, Ergocalciferol, (DRISDOL) 50000 UNITS CAPS capsule Take 50,000 Units by mouth every 7 (seven) days.    Historical Provider, MD    Allergies Ace inhibitors  History reviewed. No pertinent family history.  Social History Social History  Substance Use Topics  . Smoking status: Never Smoker  . Smokeless tobacco: Never Used  . Alcohol use No    Review of Systems  Constitutional: No fever/chills. Eyes: No visual changes. ENT: No sore throat. Cardiovascular: Denies chest pain. Respiratory: Denies shortness of breath. Gastrointestinal: No abdominal pain.  No nausea, no vomiting.  No diarrhea.  No constipation. Genitourinary: Negative for dysuria. Musculoskeletal: Positive for neck pain. Negative for back pain. Skin: Negative for rash. Neurological: Negative for headaches, focal weakness or numbness.  10-point ROS otherwise negative.  ____________________________________________   PHYSICAL EXAM:  VITAL SIGNS: ED Triage Vitals  Enc Vitals Group     BP 03/24/16 2326 134/64     Pulse Rate 03/24/16 2326 78  Resp 03/24/16 2326 18     Temp 03/24/16 2326 97.9 F (36.6 C)     Temp Source 03/24/16 2326 Oral     SpO2 03/24/16 2326 100 %     Weight 03/24/16 2325 280 lb (127 kg)     Height 03/24/16 2325 5\' 7"  (1.702 m)     Head Circumference --      Peak Flow --      Pain Score 03/24/16 2325 8     Pain Loc --      Pain Edu? --      Excl. in GC? --     Constitutional: Alert and oriented. Well appearing and  in no acute distress. Eyes: Conjunctivae are normal. PERRL. EOMI. Head: Atraumatic. Nose: No congestion/rhinnorhea. Mouth/Throat: Mucous membranes are moist.  Oropharynx non-erythematous. Neck: No stridor.  No carotid bruits.  No cervical spine tenderness to palpation.  Right paraspinal muscle spasms. Cardiovascular: Normal rate, regular rhythm. Grossly normal heart sounds.  Good peripheral circulation. Respiratory: Normal respiratory effort.  No retractions. Lungs CTAB. Gastrointestinal: Soft and nontender. No distention. No abdominal bruits. No CVA tenderness. Musculoskeletal: No lower extremity tenderness nor edema.  No joint effusions. Neurologic:  Normal speech and language. No gross focal neurologic deficits are appreciated. 5/5 motor strength and sensation BUE. No gait instability. Skin:  Skin is warm, dry and intact. No rash noted. Psychiatric: Mood and affect are normal. Speech and behavior are normal.  ____________________________________________   LABS (all labs ordered are listed, but only abnormal results are displayed)  Labs Reviewed - No data to display ____________________________________________  EKG  None ____________________________________________  RADIOLOGY  None ____________________________________________   PROCEDURES  Procedure(s) performed: None  Procedures  Critical Care performed: No  ____________________________________________   INITIAL IMPRESSION / ASSESSMENT AND PLAN / ED COURSE  Pertinent labs & imaging results that were available during my care of the patient were reviewed by me and considered in my medical decision making (see chart for details).  61 year old female with right sided muscle spasms of her neck. No focal neurological deficits noted on exam. Will treat with NSAIDs, muscle relaxer and follow-up with orthopedics. Strict return precautions given. Patient verbalizes understanding and agrees with plan of care.       ____________________________________________   FINAL CLINICAL IMPRESSION(S) / ED DIAGNOSES  Final diagnoses:  Muscle spasms of neck      NEW MEDICATIONS STARTED DURING THIS VISIT:  New Prescriptions   CYCLOBENZAPRINE (FLEXERIL) 5 MG TABLET    Take 1 tablet (5 mg total) by mouth daily.   IBUPROFEN (ADVIL,MOTRIN) 800 MG TABLET    Take 1 tablet (800 mg total) by mouth every 8 (eight) hours as needed for moderate pain.     Note:  This document was prepared using Dragon voice recognition software and may include unintentional dictation errors.    Irean Hong, MD 03/25/16 [phone]

## 2016-04-02 DIAGNOSIS — M25519 Pain in unspecified shoulder: Secondary | ICD-10-CM | POA: Insufficient documentation

## 2016-04-02 DIAGNOSIS — M5412 Radiculopathy, cervical region: Secondary | ICD-10-CM | POA: Insufficient documentation

## 2016-04-16 DIAGNOSIS — I10 Essential (primary) hypertension: Secondary | ICD-10-CM | POA: Insufficient documentation

## 2016-04-16 DIAGNOSIS — Z23 Encounter for immunization: Secondary | ICD-10-CM | POA: Insufficient documentation

## 2016-04-16 DIAGNOSIS — E782 Mixed hyperlipidemia: Secondary | ICD-10-CM | POA: Insufficient documentation

## 2016-04-16 DIAGNOSIS — M542 Cervicalgia: Secondary | ICD-10-CM | POA: Insufficient documentation

## 2016-05-26 ENCOUNTER — Emergency Department: Payer: Medicaid Other

## 2016-05-26 ENCOUNTER — Emergency Department
Admission: EM | Admit: 2016-05-26 | Discharge: 2016-05-26 | Disposition: A | Discharge status: Home / Self Care | Payer: Medicaid Other | Attending: Emergency Medicine | Admitting: Emergency Medicine

## 2016-05-26 DIAGNOSIS — E119 Type 2 diabetes mellitus without complications: Secondary | ICD-10-CM | POA: Insufficient documentation

## 2016-05-26 DIAGNOSIS — Z791 Long term (current) use of non-steroidal anti-inflammatories (NSAID): Secondary | ICD-10-CM | POA: Diagnosis not present

## 2016-05-26 DIAGNOSIS — R42 Dizziness and giddiness: Secondary | ICD-10-CM | POA: Diagnosis present

## 2016-05-26 DIAGNOSIS — Z7984 Long term (current) use of oral hypoglycemic drugs: Secondary | ICD-10-CM | POA: Diagnosis not present

## 2016-05-26 DIAGNOSIS — Z79899 Other long term (current) drug therapy: Secondary | ICD-10-CM | POA: Diagnosis not present

## 2016-05-26 DIAGNOSIS — M4802 Spinal stenosis, cervical region: Secondary | ICD-10-CM | POA: Diagnosis not present

## 2016-05-26 DIAGNOSIS — I10 Essential (primary) hypertension: Secondary | ICD-10-CM | POA: Diagnosis not present

## 2016-05-26 DIAGNOSIS — Z7982 Long term (current) use of aspirin: Secondary | ICD-10-CM | POA: Diagnosis not present

## 2016-05-26 LAB — CBC
HEMATOCRIT: 38.6 % (ref 35.0–47.0)
Hemoglobin: 12.4 g/dL (ref 12.0–16.0)
MCH: 26.8 pg (ref 26.0–34.0)
MCHC: 32.1 g/dL (ref 32.0–36.0)
MCV: 83.4 fL (ref 80.0–100.0)
Platelets: 176 10*3/uL (ref 150–440)
RBC: 4.63 MIL/uL (ref 3.80–5.20)
RDW: 17.3 % — AB (ref 11.5–14.5)
WBC: 5.7 10*3/uL (ref 3.6–11.0)

## 2016-05-26 LAB — URINALYSIS, COMPLETE (UACMP) WITH MICROSCOPIC
BACTERIA UA: NONE SEEN
Bilirubin Urine: NEGATIVE
GLUCOSE, UA: NEGATIVE mg/dL
Hgb urine dipstick: NEGATIVE
KETONES UR: NEGATIVE mg/dL
Nitrite: NEGATIVE
PH: 6 (ref 5.0–8.0)
Protein, ur: NEGATIVE mg/dL
RBC / HPF: NONE SEEN RBC/hpf (ref 0–5)
SPECIFIC GRAVITY, URINE: 1.019 (ref 1.005–1.030)

## 2016-05-26 LAB — GLUCOSE, CAPILLARY: GLUCOSE-CAPILLARY: 92 mg/dL (ref 65–99)

## 2016-05-26 LAB — BASIC METABOLIC PANEL
Anion gap: 8 (ref 5–15)
BUN: 19 mg/dL (ref 6–20)
CALCIUM: 10 mg/dL (ref 8.9–10.3)
CO2: 27 mmol/L (ref 22–32)
Chloride: 104 mmol/L (ref 101–111)
Creatinine, Ser: 0.87 mg/dL (ref 0.44–1.00)
GFR calc Af Amer: >60 mL/min (ref >60)
GFR calc non Af Amer: >60 mL/min (ref >60)
GLUCOSE: 123 mg/dL — AB (ref 65–99)
POTASSIUM: 4 mmol/L (ref 3.5–5.1)
Sodium: 139 mmol/L (ref 135–145)

## 2016-05-26 MED ORDER — GADOBENATE DIMEGLUMINE 529 MG/ML IV SOLN
20.0000 mL | Freq: Once | INTRAVENOUS | Status: AC | PRN
  Administered 2016-05-26: 20 mL via INTRAVENOUS

## 2016-05-26 MED ORDER — MECLIZINE HCL 32 MG PO TABS: 32.0000 mg | ORAL_TABLET | Freq: Three times a day (TID) | ORAL | 0 refills | Status: DC | PRN

## 2016-05-26 NOTE — ED Notes (Signed)
FIRST NURSE NOTE: Dizziness since yesterday. Denies headache or weakness. Has hx of dizziness. Ambulated in to lobby without difficulty. Placed in wheelchair.

## 2016-05-26 NOTE — ED Notes (Signed)
Pt verbalized understanding of discharge instructions. NAD at this time. 

## 2016-05-26 NOTE — ED Provider Notes (Addendum)
[facility] Emergency Department Provider Note   ____________________________________________   First MD Initiated Contact with Patient 05/26/16 [phone]     (approximate)  I have reviewed the triage vital signs and the nursing notes.   HISTORY  Chief Complaint Dizziness    HPI Diane Hanel Nawaz is a 61 y.o. female patient reports since March 1 she has been having dizziness. She is also weak. The dizziness is spinning with made worse with head movement and sitting or standing. She also complains of some heart palpitations but no chest pain or shortness of breath. Dizziness last possibly after half an hour to time. Was much worse yesterday. She has no local weakness no numbness and no other complaints.  Past Medical History:  Diagnosis Date  . Diabetes mellitus without complication (HCC)   . Hypertension   . Thyroid disease     There are no active problems to display for this patient.   Past Surgical History:  Procedure Laterality Date  . ABDOMINAL HYSTERECTOMY    . CHOLECYSTECTOMY      Prior to Admission medications   Medication Sig Start Date End Date Taking? Authorizing Provider  acetaminophen (TYLENOL) 650 MG CR tablet Take 650 mg by mouth every 8 (eight) hours as needed for pain.    Historical Provider, MD  aspirin EC 81 MG tablet Take 81 mg by mouth daily.    Historical Provider, MD  cyclobenzaprine (FLEXERIL) 5 MG tablet Take 1 tablet (5 mg total) by mouth daily. 03/25/16   Irean Hong, MD  diphenhydrAMINE (BENADRYL) 25 MG tablet Take 25 mg by mouth every 6 (six) hours as needed.    Historical Provider, MD  hydrochlorothiazide (HYDRODIURIL) 25 MG tablet Take 25 mg by mouth daily.    Historical Provider, MD  ibuprofen (ADVIL,MOTRIN) 800 MG tablet Take 1 tablet (800 mg total) by mouth every 8 (eight) hours as needed for moderate pain. 03/25/16   Irean Hong, MD  Levothyroxine Sodium 150 MCG CAPS Take 150 mcg by mouth daily before breakfast.      Historical Provider, MD  losartan (COZAAR) 25 MG tablet Take 25 mg by mouth daily.    Historical Provider, MD  meclizine (ANTIVERT) 32 MG tablet Take 1 tablet (32 mg total) by mouth 3 (three) times daily as needed. 05/26/16   Arnaldo Natal, MD  metFORMIN (GLUCOPHAGE) 500 MG tablet Take by mouth 2 (two) times daily with a meal.    Historical Provider, MD  simvastatin (ZOCOR) 20 MG tablet Take 20 mg by mouth daily.    Historical Provider, MD  Vitamin D, Ergocalciferol, (DRISDOL) 50000 UNITS CAPS capsule Take 50,000 Units by mouth every 7 (seven) days.    Historical Provider, MD    Alle  History reviewed. No pertinent family history.  Social History Social History  Substance Use Topics  . Smoking status: Never Smoker  . Smokeless tobacco: Never Used  . Alcohol use No    Review of Systems Constitutional: No fever/chills Eyes: No visual changes. ENT: No sore throat. Cardiovascular: Denies chest pain. Respiratory: Denies shortness of breath. Gastrointestinal: No abdominal pain.  No nausea, no vomiting.  No diarrhea.  No constipation. Genitourinary: Negative for dysuria. Musculoskeletal: Negative for back pain. Skin: Negative for rash. Neurological: Negative for headaches, focal weakness or numbness.  10-point ROS otherwise negative.  ____________________________________________   PHYSICAL EXAM:  VITAL SIGNS: ED Triage Vitals  Enc Vitals Group     BP 05/26/16 0739 116/74  Pulse Rate 05/26/16 0739 82     Resp 05/26/16 0739 12     Temp --      Temp src --      SpO2 05/26/16 0739 100 %     Weight 05/26/16 0741 280 lb (127 kg)     Height 05/26/16 0741  (1.702 m)     Head Circumference --      Peak Flow --      Pain Score 05/26/16 0739 0     Pain Loc --      Pain Edu? --      Excl. in GC? --     Constitutional: Alert and oriented. Well appearing and in no acute distress. Eyes: Conjunctivae are normal. PERRL. EOMI.Fundi look normal bilaterally Head:  Atraumatic. Nose: No congestion/rhinnorhea. Mouth/Throat: Mucous membranes are moist.  Oropharynx non-erythematous. Neck: No stridor.  Cardiovascular: Normal rate, regular rhythm. Grossly normal heart sounds.  Good peripheral circulation. Respiratory: Normal respiratory effort.  No retractions. Lungs CTAB. Gastrointestinal: Soft and nontender. No distention. No abdominal bruits. No CVA tenderness. Musculoskeletal: No lower extremity tenderness nor edema.  No joint effusions. Neurologic:  Normal speech and language. No gross focal neurologic deficits are appreciated specifically cranial nerves II through XII are intact of the visual fields are not checked cerebellar finger to nose and rapid alternating movements and hands is normal motor strength is 5 over 5 throughout. Patient denies any numbness. Movement of the head from side-to-side reproduces the patient's dizziness however there is no nystagmus seen. There is no vomiting.. Skin:  Skin is warm, dry and intact. No rash noted. Psychiatric: Mood and affect are normal. Speech and behavior are normal.  ____________________________________________   LABS (all labs ordered are listed, but only abnormal results are displayed)  Labs Reviewed  BASIC METABOLIC PANEL - Abnormal; Notable for the following:       Result Value   Glucose, Bld 123 (*)    All other components within normal limits  CBC - Abnormal; Notable for the following:    RDW 17.3 (*)    All other components within normal limits  URINALYSIS, COMPLETE (UACMP) WITH MICROSCOPIC - Abnormal; Notable for the following:    Color, Urine YELLOW (*)    APPearance HAZY (*)    Leukocytes, UA TRACE (*)    Squamous Epithelial / LPF 6-30 (*)    All other components within normal limits  GLUCOSE, CAPILLARY  CBG MONITORING, ED   ____________________________________________  EKG  EKG read and interpreted by me shows sinus rhythm rate of 83 normal axis no acute ST-T wave  changes ____________________________________________  RADIOLOGY  Study Result   CLINICAL DATA:  Dizziness for 3 weeks  EXAM: MRI HEAD WITHOUT AND WITH CONTRAST  TECHNIQUE: Multiplanar, multiecho pulse sequences of the brain and surrounding structures were obtained without and with intravenous contrast.  CONTRAST:  20mL MULTIHANCE GADOBENATE DIMEGLUMINE 529 MG/ML IV SOLN  COMPARISON:  Head CT 09/09/2014  FINDINGS: Brain: No focal diffusion restriction to indicate acute infarct. No intraparenchymal hemorrhage. There is scattered hyperintense T2-weighted signal within the periventricular white matter, which may be seen in the setting of migraine headaches or early chronic microvascular disease; however, it is also seen in normal patients of this age. No mass lesion or midline shift. No hydrocephalus or extra-axial fluid collection. The midline structures are normal. No age advanced or lobar predominant atrophy.  Vascular: Major intracranial arterial and venous sinus flow voids are preserved. No evidence of chronic microhemorrhage or amyloid angiopathy.  Skull and upper cervical spine: Disc extrusion at C3-C4 with minimal inferior migration causes at least moderate spinal canal stenosis and indents the ventral spinal cord.  Sinuses/Orbits: Bilateral maxillary sinus retention cysts. No mastoid effusion. Normal orbits.  IMPRESSION: 1. No acute intracranial abnormality or specific finding to explain the patient dizziness. 2. Moderate to severe spinal canal stenosis at the C3-C4 level secondary to disc extrusion, with mild deformity of the spinal cord. This could be further evaluated with dedicated cervical spine MRI.   Electronically Signed   By: Deatra Robinson M.D.   On: 05/26/2016 13:08    ____________________________________________   PROCEDURES  Procedure(s) performed:   Procedures  Critical Care performed:  \  ____________________________________________   INITIAL IMPRESSION / ASSESSMENT AND PLAN / ED COURSE  Pertinent labs & imaging results that were available during my care of the patient were reviewed by me and considered in my medical decision making (see chart for details).   patient is seeing Dr. Hyacinth Meeker for the next problem which he she knows about the spinal stenosis.   ____________________________________________   FINAL CLINICAL IMPRESSION(S) / ED DIAGNOSES  Final diagnoses:  Dizziness  Spinal stenosis of cervical region      NEW MEDICATIONS STARTED DURING THIS VISIT:  New Prescriptions   MECLIZINE (ANTIVERT) 32 MG TABLET    Take 1 tablet (32 mg total) by mouth 3 (three) times daily as needed.     Note:  This document was prepared using Dragon voice recognition software and may include unintentional dictation errors.    Arnaldo Natal, MD 05/26/16 1339    Arnaldo Natal, MD 05/26/16 [phone]

## 2016-05-26 NOTE — ED Notes (Signed)
MD Malinda at bedside. 

## 2016-05-26 NOTE — ED Triage Notes (Signed)
Pt presents via POV c/o intermittent dizziness x3 weeks. Report called EMS yesterday but didn't report to hospital because vital were normal.

## 2016-05-26 NOTE — ED Notes (Signed)
Patient transported to MRI 

## 2016-05-26 NOTE — Discharge Instructions (Signed)
Try the Antivert for her dizziness. Please return if it's worse or no better in 3 or 4 days or follow-up with your family doctor.

## 2016-09-20 DIAGNOSIS — K5900 Constipation, unspecified: Secondary | ICD-10-CM | POA: Insufficient documentation

## 2017-08-11 DIAGNOSIS — M179 Osteoarthritis of knee, unspecified: Secondary | ICD-10-CM | POA: Insufficient documentation

## 2017-08-11 DIAGNOSIS — M1710 Unilateral primary osteoarthritis, unspecified knee: Secondary | ICD-10-CM | POA: Insufficient documentation

## 2020-05-25 ENCOUNTER — Encounter (INDEPENDENT_AMBULATORY_CARE_PROVIDER_SITE_OTHER): Payer: Self-pay | Admitting: Vascular Surgery

## 2020-05-31 DIAGNOSIS — I10 Essential (primary) hypertension: Secondary | ICD-10-CM | POA: Insufficient documentation

## 2020-05-31 DIAGNOSIS — I8311 Varicose veins of right lower extremity with inflammation: Secondary | ICD-10-CM | POA: Insufficient documentation

## 2020-05-31 DIAGNOSIS — E785 Hyperlipidemia, unspecified: Secondary | ICD-10-CM | POA: Insufficient documentation

## 2020-05-31 DIAGNOSIS — E119 Type 2 diabetes mellitus without complications: Secondary | ICD-10-CM | POA: Insufficient documentation

## 2020-05-31 NOTE — Progress Notes (Signed)
MRN : [id number]  Lauren Walton is a 65 y.o. (April 15, 1955) female who presents with chief complaint of No chief complaint on file. Marland Kitchen  History of Present Illness:   The patient is seen for evaluation of symptomatic varicose veins. The patient relates burning and stinging which worsened steadily throughout the course of the day, particularly with standing. The patient also notes an aching and throbbing pain over the varicosities, particularly with prolonged dependent positions. The symptoms are significantly improved with elevation.  The patient also notes that during hot weather the symptoms are greatly intensified. The patient states the pain from the varicose veins interferes with work, daily exercise, shopping and household maintenance. At this point, the symptoms are persistent and severe enough that they're having a negative impact on lifestyle and are interfering with daily activities.  There is no history of DVT, PE or superficial thrombophlebitis. There is no history of ulceration or hemorrhage. The patient denies a significant family history of varicose veins.  The patient has not worn graduated compression in the past. At the present time the patient has not been using over-the-counter analgesics. There is no history of prior surgical intervention or sclerotherapy.    No outpatient medications have been marked as taking for the 06/01/20 encounter (Appointment) with Gilda Crease, Latina Craver, MD.    Past Medical History:  Diagnosis Date  . Diabetes mellitus without complication (HCC)   . Hypertension   . Thyroid disease     Past Surgical History:  Procedure Laterality Date  . ABDOMINAL HYSTERECTOMY    . CHOLECYSTECTOMY      Social History Social History   Tobacco Use  . Smoking status: Never Smoker  . Smokeless tobacco: Never Used  Substance Use Topics  . Alcohol use: No  . Drug use: No    Family History No family history of bleeding/clotting disorders, porphyria or  autoimmune disease   Allergies  Allergen Reactions  . Ace Inhibitors Swelling  . Percocet [Oxycodone-Acetaminophen]     dizziness     REVIEW OF SYSTEMS (Negative unless checked)  Constitutional: [] Weight loss  [] Fever  [] Chills Cardiac: [] Chest pain   [] Chest pressure   [] Palpitations   [] Shortness of breath when laying flat   [] Shortness of breath with exertion. Vascular:  [] Pain in legs with walking   [x] Pain in legs at rest  [] History of DVT   [] Phlebitis   [x] Swelling in legs   [x] Varicose veins   [] Non-healing ulcers Pulmonary:   [] Uses home oxygen   [] Productive cough   [] Hemoptysis   [] Wheeze  [] COPD   [] Asthma Neurologic:  [] Dizziness   [] Seizures   [] History of stroke   [] History of TIA  [] Aphasia   [] Vissual changes   [] Weakness or numbness in arm   [] Weakness or numbness in leg Musculoskeletal:   [] Joint swelling   [] Joint pain   [] Low back pain Hematologic:  [] Easy bruising  [] Easy bleeding   [] Hypercoagulable state   [] Anemic Gastrointestinal:  [] Diarrhea   [] Vomiting  [] Gastroesophageal reflux/heartburn   [] Difficulty swallowing. Genitourinary:  [] Chronic kidney disease   [] Difficult urination  [] Frequent urination   [] Blood in urine Skin:  [] Rashes   [] Ulcers  Psychological:  [] History of anxiety   []  History of major depression.  Physical Examination  There were no vitals filed for this visit. There is no height or weight on file to calculate BMI. Gen: WD/WN, NAD Head: [place]/AT, No temporalis wasting.  Ear/Nose/Throat: Hearing grossly intact, nares w/o erythema or drainage, poor dentition Eyes:  PER, EOMI, sclera nonicteric.  Neck: Supple, no masses.  No bruit or JVD.  Pulmonary:  Good air movement, clear to auscultation bilaterally, no use of accessory muscles.  Cardiac: RRR, normal S1, S2, no Murmurs. Vascular: Large varicosities present extensively greater than 10 mm right leg.  Mild venous stasis changes to the legs bilaterally.  2+ soft pitting edema Vessel Right  Left  Radial Palpable Palpable  PT Not Palpable Not Palpable  DP Not Palpable Not Palpable  Gastrointestinal: soft, non-distended. No guarding/no peritoneal signs.  Musculoskeletal: M/S 5/5 throughout.  No deformity or atrophy.  Neurologic: CN 2-12 intact. Pain and light touch intact in extremities.  Symmetrical.  Speech is fluent. Motor exam as listed above. Psychiatric: Judgment intact, Mood & affect appropriate for pt's clinical situation. Dermatologic: Venous rashes no ulcers noted.  No changes consistent with cellulitis. Lymph : No Cervical lymphadenopathy, no lichenification or skin changes of chronic lymphedema.  CBC Lab Results  Component Value Date   WBC 5.7 05/26/2016   HGB 12.4 05/26/2016   HCT 38.6 05/26/2016   MCV 83.4 05/26/2016   PLT 176 05/26/2016    BMET    Component Value Date/Time   NA 139 05/26/2016 0807   K 4.0 05/26/2016 0807   CL 104 05/26/2016 0807   CO2 27 05/26/2016 0807   GLUCOSE 123 (H) 05/26/2016 0807   BUN 19 05/26/2016 0807   CREATININE 0.87 05/26/2016 0807   CALCIUM 10.0 05/26/2016 0807   GFRNONAA >60 05/26/2016 0807   GFRAA >60 05/26/2016 0807   CrCl cannot be calculated (Patient's most recent lab result is older than the maximum 21 days allowed.).  COAG No results found for: INR, PROTIME  Radiology No results found.   Assessment/Plan 1. Pain and swelling of lower leg, unspecified laterality  Recommend:  The patient has large symptomatic varicose veins that are painful and associated with swelling.  Given her venous insufficiency leg edema prominent tender varicose veins of the right calf as well as her poorly palpable pedal pulses noninvasive studies including venous ultrasound with reflux as well as an ABI will be obtained.  I have had a long discussion with the patient regarding  varicose veins and why they cause symptoms.  Patient will begin wearing graduated compression stockings class 1 on a daily basis, beginning first thing  in the morning and removing them in the evening. The patient is instructed specifically not to sleep in the stockings.    The patient  will also begin using over-the-counter analgesics such as Motrin 600 mg po TID to help control the symptoms.    In addition, behavioral modification including elevation during the day will be initiated.    Pending the results of these changes the  patient will be reevaluated in three months.   An  ultrasound of the venous system will be obtained.   Further plans will be based on the ultrasound results and whether conservative therapies are successful at eliminating the pain and swelling.  - VAS [place] ABI WITH/WO TBI; Future - VAS [place] LOWER EXTREMITY VENOUS REFLUX; Future  2. Varicose veins of both lower extremities with inflammation See #1  3. Type 2 diabetes mellitus with chronic kidney disease, without long-term current use of insulin, unspecified CKD stage (HCC) Continue hypoglycemic medications as already ordered, these medications have been reviewed and there are no changes at this time.  Hgb A1C to be monitored as already arranged by primary service   4. Mixed hyperlipidemia Continue statin as ordered and  reviewed, no changes at this time   5. Essential hypertension Continue antihypertensive medications as already ordered, these medications have been reviewed and there are no changes at this time.     Levora Dredge, MD  05/31/2020 4:26 PM

## 2020-06-01 ENCOUNTER — Encounter (INDEPENDENT_AMBULATORY_CARE_PROVIDER_SITE_OTHER): Payer: Self-pay | Admitting: Vascular Surgery

## 2020-06-01 ENCOUNTER — Ambulatory Visit (INDEPENDENT_AMBULATORY_CARE_PROVIDER_SITE_OTHER): Payer: Medicare HMO | Admitting: Vascular Surgery

## 2020-06-01 ENCOUNTER — Other Ambulatory Visit: Payer: Self-pay

## 2020-06-01 DIAGNOSIS — E1122 Type 2 diabetes mellitus with diabetic chronic kidney disease: Secondary | ICD-10-CM

## 2020-06-01 DIAGNOSIS — E782 Mixed hyperlipidemia: Secondary | ICD-10-CM | POA: Diagnosis not present

## 2020-06-01 DIAGNOSIS — M79669 Pain in unspecified lower leg: Secondary | ICD-10-CM

## 2020-06-01 DIAGNOSIS — M7989 Other specified soft tissue disorders: Secondary | ICD-10-CM

## 2020-06-01 DIAGNOSIS — I8311 Varicose veins of right lower extremity with inflammation: Secondary | ICD-10-CM | POA: Diagnosis not present

## 2020-06-01 DIAGNOSIS — I8312 Varicose veins of left lower extremity with inflammation: Secondary | ICD-10-CM

## 2020-06-01 DIAGNOSIS — I10 Essential (primary) hypertension: Secondary | ICD-10-CM

## 2020-06-02 ENCOUNTER — Encounter (INDEPENDENT_AMBULATORY_CARE_PROVIDER_SITE_OTHER): Payer: Self-pay | Admitting: Vascular Surgery

## 2020-06-02 DIAGNOSIS — M79669 Pain in unspecified lower leg: Secondary | ICD-10-CM | POA: Insufficient documentation

## 2020-06-02 DIAGNOSIS — M7989 Other specified soft tissue disorders: Secondary | ICD-10-CM | POA: Insufficient documentation

## 2020-08-31 ENCOUNTER — Ambulatory Visit (INDEPENDENT_AMBULATORY_CARE_PROVIDER_SITE_OTHER): Payer: Medicare HMO | Admitting: Nurse Practitioner

## 2020-08-31 ENCOUNTER — Encounter (INDEPENDENT_AMBULATORY_CARE_PROVIDER_SITE_OTHER): Payer: Medicare HMO

## 2020-10-02 ENCOUNTER — Other Ambulatory Visit (INDEPENDENT_AMBULATORY_CARE_PROVIDER_SITE_OTHER): Payer: Self-pay | Admitting: Vascular Surgery

## 2020-10-02 DIAGNOSIS — M7989 Other specified soft tissue disorders: Secondary | ICD-10-CM

## 2020-10-02 DIAGNOSIS — R0989 Other specified symptoms and signs involving the circulatory and respiratory systems: Secondary | ICD-10-CM

## 2020-10-02 DIAGNOSIS — M79604 Pain in right leg: Secondary | ICD-10-CM

## 2020-10-03 ENCOUNTER — Encounter (INDEPENDENT_AMBULATORY_CARE_PROVIDER_SITE_OTHER): Payer: Self-pay | Admitting: Nurse Practitioner

## 2020-10-03 ENCOUNTER — Ambulatory Visit (INDEPENDENT_AMBULATORY_CARE_PROVIDER_SITE_OTHER): Payer: Medicare HMO

## 2020-10-03 ENCOUNTER — Ambulatory Visit (INDEPENDENT_AMBULATORY_CARE_PROVIDER_SITE_OTHER): Payer: Medicare HMO | Admitting: Nurse Practitioner

## 2020-10-03 ENCOUNTER — Other Ambulatory Visit: Payer: Self-pay

## 2020-10-03 VITALS — BP 159/79 | HR 79 | Ht 66.0 in | Wt 261.0 lb

## 2020-10-03 DIAGNOSIS — M79669 Pain in unspecified lower leg: Secondary | ICD-10-CM | POA: Diagnosis not present

## 2020-10-03 DIAGNOSIS — I8311 Varicose veins of right lower extremity with inflammation: Secondary | ICD-10-CM | POA: Diagnosis not present

## 2020-10-03 DIAGNOSIS — I8312 Varicose veins of left lower extremity with inflammation: Secondary | ICD-10-CM | POA: Diagnosis not present

## 2020-10-03 DIAGNOSIS — R0989 Other specified symptoms and signs involving the circulatory and respiratory systems: Secondary | ICD-10-CM

## 2020-10-03 DIAGNOSIS — M7989 Other specified soft tissue disorders: Secondary | ICD-10-CM

## 2020-10-03 DIAGNOSIS — M79604 Pain in right leg: Secondary | ICD-10-CM

## 2020-10-03 DIAGNOSIS — E1122 Type 2 diabetes mellitus with diabetic chronic kidney disease: Secondary | ICD-10-CM | POA: Diagnosis not present

## 2020-10-03 NOTE — Progress Notes (Signed)
Subjective:    Patient ID: Lauren Walton, female    DOB: Jul 17, 1955, 65 y.o.   MRN: [id number] Chief Complaint  Patient presents with   Follow-up    3 mo U/S    Lauren Walton is a 65 year old female that returns for followup evaluation 3 months after the initial visit.  Patient notes that she continues to utilize medical grade compression stockings.  She notes that she has some discomfort and swelling but not drastically currently.  Denies any claudication-like symptoms or rest pain.  She denies any fevers or chills.  Today noninvasive studies show in the right lower extremity the patient has evidence of deep venous insufficiency as well as superficial venous reflux extending from the great saphenous vein at the saphenofemoral junction to the proximal calf.  No evidence of DVT or superficial thrombophlebitis  Today the right lower extremity has an ABI of 1.11 with a left ABI of 1.15.Marland Kitchen  Patient has biphasic/triphasic waveforms bilaterally with good toe waveforms bilaterally.     Review of Systems  Cardiovascular:  Positive for leg swelling.  Musculoskeletal:  Positive for arthralgias, gait problem and joint swelling.  All other systems reviewed and are negative.     Objective:   Physical Exam Vitals reviewed.  Cardiovascular:     Rate and Rhythm: Normal rate.     Comments: Nonpalpable pulses Pulmonary:     Effort: Pulmonary effort is normal.  Musculoskeletal:        General: Swelling present.  Neurological:     Mental Status: She is alert and oriented to person, place, and time.  Psychiatric:        Mood and Affect: Mood normal.        Behavior: Behavior normal.        Thought Content: Thought content normal.        Judgment: Judgment normal.    BP (!) 159/79   Pulse 79   Ht 5\' 6"  (1.676 m)   Wt 261 lb (118.4 kg)   BMI 42.13 kg/m   Past Medical History:  Diagnosis Date   Diabetes mellitus without complication (HCC)    Hypertension    Thyroid disease      Social History   Socioeconomic History   Marital status: Married    Spouse name: Not on file   Number of children: Not on file   Years of education: Not on file   Highest education level: Not on file  Occupational History   Not on file  Tobacco Use   Smoking status: Never   Smokeless tobacco: Never  Substance and Sexual Activity   Alcohol use: No   Drug use: No   Sexual activity: Not on file  Other Topics Concern   Not on file  Social History Narrative   Not on file   Social Determinants of Health   Financial Resource Strain: Not on file  Food Insecurity: Not on file  Transportation Needs: Not on file  Physical Activity: Not on file  Stress: Not on file  Social Connections: Not on file  Intimate Partner Violence: Not on file    Past Surgical History:  Procedure Laterality Date   ABDOMINAL HYSTERECTOMY     CHOLECYSTECTOMY      Family History  Problem Relation Age of Onset   Diabetes Mother    Heart disease Mother    Arthritis Mother    Colon cancer Father     Allergies  Allergen Reactions   Ace Inhibitors Swelling  Penicillins Other (See Comments)   Percocet [Oxycodone-Acetaminophen]     dizziness    CBC Latest Ref Rng & Units 05/26/2016 11/06/2015 04/05/2015  WBC 3.6 - 11.0 K/uL 5.7 5.6 6.9  Hemoglobin 12.0 - 16.0 g/dL 30.0 11.4(L) 10.8(L)  Hematocrit 35.0 - 47.0 % 38.6 34.8(L) 33.5(L)  Platelets 150 - 440 K/uL 176 169 194      CMP     Component Value Date/Time   NA 139 05/26/2016 0807   K 4.0 05/26/2016 0807   CL 104 05/26/2016 0807   CO2 27 05/26/2016 0807   GLUCOSE 123 (H) 05/26/2016 0807   BUN 19 05/26/2016 0807   CREATININE 0.87 05/26/2016 0807   CALCIUM 10.0 05/26/2016 0807   PROT 8.0 11/06/2015 1738   ALBUMIN 4.1 11/06/2015 1738   AST 19 11/06/2015 1738   ALT 13 (L) 11/06/2015 1738   ALKPHOS 64 11/06/2015 1738   BILITOT 0.4 11/06/2015 1738   GFRNONAA >60 05/26/2016 0807   GFRAA >60 05/26/2016 0807     No results  found.     Assessment & Plan:   1. Varicose veins of both lower extremities with inflammation Currently the patient does not have any pain or issues with her varicose veins.  Due to this we will continue with conservative therapy.  The patient is advised to continue with use of medical grade compression stockings that she should wear daily.  The patient should put them on first thing in the morning and take them off before bedtime and not wear them to bed.  She is also elevate her lower extremities when possible.  She should also endeavor to walk at least 15 to 20 minutes 4 to 5 days a week.  We will have the patient return in 6 months to evaluate progress.  2. Pain and swelling of lower leg, unspecified laterality Noninvasive studies today show no issues with her arterial circulation.  The patient may have discomfort from varicose veins but currently they are not causing her any issues.  Patient will follow conservative therapy as outlined above.  3. Type 2 diabetes mellitus with chronic kidney disease, without long-term current use of insulin, unspecified CKD stage (HCC) Continue hypoglycemic medications as already ordered, these medications have been reviewed and there are no changes at this time.  Hgb A1C to be monitored as already arranged by primary service    Current Outpatient Medications on File Prior to Visit  Medication Sig Dispense Refill   acetaminophen (TYLENOL) 650 MG CR tablet Take 650 mg by mouth every 8 (eight) hours as needed for pain.     aspirin EC 81 MG tablet Take 81 mg by mouth daily.     cetirizine (ZYRTEC) 10 MG tablet Take 1 tablet by mouth daily.     cyclobenzaprine (FLEXERIL) 5 MG tablet Take 1 tablet (5 mg total) by mouth daily. 15 tablet 0   diphenhydrAMINE (BENADRYL) 25 MG tablet Take 25 mg by mouth every 6 (six) hours as needed.     hydrochlorothiazide (HYDRODIURIL) 25 MG tablet Take 25 mg by mouth daily.     ibuprofen (ADVIL,MOTRIN) 800 MG tablet Take 1  tablet (800 mg total) by mouth every 8 (eight) hours as needed for moderate pain. 15 tablet 0   Levothyroxine Sodium 150 MCG CAPS Take 150 mcg by mouth daily before breakfast.      losartan (COZAAR) 25 MG tablet Take 25 mg by mouth daily.     meclizine (ANTIVERT) 32 MG tablet Take 1 tablet (32  mg total) by mouth 3 (three) times daily as needed. 30 tablet 0   metFORMIN (GLUCOPHAGE) 500 MG tablet Take by mouth 2 (two) times daily with a meal.     omeprazole (PRILOSEC) 40 MG capsule Take by mouth.     simvastatin (ZOCOR) 20 MG tablet Take 20 mg by mouth daily.     traMADol (ULTRAM) 50 MG tablet Take 50 mg by mouth every 6 (six) hours as needed.     Vitamin D, Ergocalciferol, (DRISDOL) 50000 UNITS CAPS capsule Take 50,000 Units by mouth every 7 (seven) days.     No current facility-administered medications on file prior to visit.    There are no Patient Instructions on file for this visit. No follow-ups on file.   Georgiana Spinner, NP

## 2021-03-08 ENCOUNTER — Other Ambulatory Visit: Payer: Self-pay

## 2021-03-08 ENCOUNTER — Ambulatory Visit (INDEPENDENT_AMBULATORY_CARE_PROVIDER_SITE_OTHER): Payer: Medicare HMO | Admitting: Nurse Practitioner

## 2021-03-08 VITALS — BP 122/66 | HR 83 | Ht 67.0 in | Wt 243.0 lb

## 2021-03-08 DIAGNOSIS — M79669 Pain in unspecified lower leg: Secondary | ICD-10-CM | POA: Diagnosis not present

## 2021-03-08 DIAGNOSIS — I10 Essential (primary) hypertension: Secondary | ICD-10-CM

## 2021-03-08 DIAGNOSIS — E119 Type 2 diabetes mellitus without complications: Secondary | ICD-10-CM | POA: Diagnosis not present

## 2021-03-08 DIAGNOSIS — M7989 Other specified soft tissue disorders: Secondary | ICD-10-CM

## 2021-03-10 NOTE — Progress Notes (Signed)
Subjective:    Patient ID: Lauren Walton, female    DOB: 11-Oct-1955, 66 y.o.   MRN: [id number] Chief Complaint  Patient presents with   Follow-up    6 mo no studies    Lauren Walton is a 66 year old female that returns today for follow-up of pain and swelling in her lower extremities.  It was noted that the patient had some very mild PAD but significant varicose veins.  She notes that she has been wearing her compression stockings diligently as well as elevating her lower extremities and being active.  She currently denies having any pain or issues.   Review of Systems  Cardiovascular:  Negative for leg swelling.  All other systems reviewed and are negative.     Objective:   Physical Exam HENT:     Head: Normocephalic.  Cardiovascular:     Rate and Rhythm: Normal rate.     Pulses: Normal pulses.  Pulmonary:     Effort: Pulmonary effort is normal.  Musculoskeletal:     Right lower leg: No edema.     Left lower leg: No edema.  Skin:    General: Skin is warm and dry.  Neurological:     Mental Status: She is alert and oriented to person, place, and time.  Psychiatric:        Mood and Affect: Mood normal.        Behavior: Behavior normal.        Thought Content: Thought content normal.        Judgment: Judgment normal.    BP 122/66    Pulse 83    Ht 5\' 7"  (1.702 m)    Wt 243 lb (110.2 kg)    BMI 38.06 kg/m   Past Medical History:  Diagnosis Date   Diabetes mellitus without complication ([place])    Hypertension    Thyroid disease     Social History   Socioeconomic History   Marital status: Married    Spouse name: Not on file   Number of children: Not on file   Years of education: Not on file   Highest education level: Not on file  Occupational History   Not on file  Tobacco Use   Smoking status: Never   Smokeless tobacco: Never  Substance and Sexual Activity   Alcohol use: No   Drug use: No   Sexual activity: Not on file  Other Topics Concern   Not on file   Social History Narrative   Not on file   Social Determinants of Health   Financial Resource Strain: Not on file  Food Insecurity: Not on file  Transportation Needs: Not on file  Physical Activity: Not on file  Stress: Not on file  Social Connections: Not on file  Intimate Partner Violence: Not on file    Past Surgical History:  Procedure Laterality Date   ABDOMINAL HYSTERECTOMY     CHOLECYSTECTOMY      Family History  Problem Relation Age of Onset   Diabetes Mother    Heart disease Mother    Arthritis Mother    Colon cancer Father     Allergies  Allergen Reactions   Ace Inhibitors Swelling   Penicillins Other (See Comments)   Percocet [Oxycodone-Acetaminophen]     dizziness   Aspirin     Gi complications    CBC Latest Ref Rng & Units 05/26/2016 11/06/2015 04/05/2015  WBC 3.6 - 11.0 K/uL 5.7 5.6 6.9  Hemoglobin 12.0 - 16.0 g/dL 12.4  11.4(L) 10.8(L)  Hematocrit 35.0 - 47.0 % 38.6 34.8(L) 33.5(L)  Platelets 150 - 440 K/uL 176 169 194      CMP     Component Value Date/Time   NA 139 05/26/2016 0807   K 4.0 05/26/2016 0807   CL 104 05/26/2016 0807   CO2 27 05/26/2016 0807   GLUCOSE 123 (H) 05/26/2016 0807   BUN 19 05/26/2016 0807   CREATININE 0.87 05/26/2016 0807   CALCIUM 10.0 05/26/2016 0807   PROT 8.0 11/06/2015 1738   ALBUMIN 4.1 11/06/2015 1738   AST 19 11/06/2015 1738   ALT 13 (L) 11/06/2015 1738   ALKPHOS 64 11/06/2015 1738   BILITOT 0.4 11/06/2015 1738   GFRNONAA >60 05/26/2016 0807   GFRAA >60 05/26/2016 0807     No results found.     Assessment & Plan:   1. Pain and swelling of lower leg, unspecified laterality Patient currently denies any swelling or pain.  She is doing well with wearing compression socks daily.  Based on this we will have the patient follow-up with [place] on an as-needed basis.  Patient is advised to continue with conservative therapy of elevating her lower extremities, utilizing compression and activity.  2. Type 2  diabetes mellitus without complication, unspecified whether long term insulin use ([place]) Continue hypoglycemic medications as already ordered, these medications have been reviewed and there are no changes at this time.  Hgb A1C to be monitored as already arranged by primary service   3. Primary hypertension Continue antihypertensive medications as already ordered, these medications have been reviewed and there are no changes at this time.    Current Outpatient Medications on File Prior to Visit  Medication Sig Dispense Refill   acetaminophen (TYLENOL) 650 MG CR tablet Take 650 mg by mouth every 8 (eight) hours as needed for pain.     aspirin EC 81 MG tablet Take 81 mg by mouth daily.     cetirizine (ZYRTEC) 10 MG tablet Take 1 tablet by mouth daily.     cyclobenzaprine (FLEXERIL) 5 MG tablet Take 1 tablet (5 mg total) by mouth daily. 15 tablet 0   diphenhydrAMINE (BENADRYL) 25 MG tablet Take 25 mg by mouth every 6 (six) hours as needed.     hydrochlorothiazide (HYDRODIURIL) 25 MG tablet Take 25 mg by mouth daily.     ibuprofen (ADVIL,MOTRIN) 800 MG tablet Take 1 tablet (800 mg total) by mouth every 8 (eight) hours as needed for moderate pain. 15 tablet 0   Levothyroxine Sodium 150 MCG CAPS Take 150 mcg by mouth daily before breakfast.      losartan (COZAAR) 25 MG tablet Take 25 mg by mouth daily.     meclizine (ANTIVERT) 32 MG tablet Take 1 tablet (32 mg total) by mouth 3 (three) times daily as needed. 30 tablet 0   metFORMIN (GLUCOPHAGE) 500 MG tablet Take by mouth 2 (two) times daily with a meal.     simvastatin (ZOCOR) 20 MG tablet Take 20 mg by mouth daily.     traMADol (ULTRAM) 50 MG tablet Take 50 mg by mouth every 6 (six) hours as needed.     Vitamin D, Ergocalciferol, (DRISDOL) 50000 UNITS CAPS capsule Take 50,000 Units by mouth every 7 (seven) days.     omeprazole (PRILOSEC) 40 MG capsule Take by mouth.     No current facility-administered medications on file prior to visit.     There are no Patient Instructions on file for this visit. No follow-ups on  file.   Kris Hartmann, NP

## 2021-03-11 ENCOUNTER — Encounter (INDEPENDENT_AMBULATORY_CARE_PROVIDER_SITE_OTHER): Payer: Self-pay | Admitting: Nurse Practitioner

## 2021-03-22 ENCOUNTER — Other Ambulatory Visit: Payer: Self-pay | Admitting: Nurse Practitioner

## 2021-03-22 DIAGNOSIS — Z1231 Encounter for screening mammogram for malignant neoplasm of breast: Secondary | ICD-10-CM

## 2021-04-03 ENCOUNTER — Ambulatory Visit (INDEPENDENT_AMBULATORY_CARE_PROVIDER_SITE_OTHER): Payer: Medicare HMO | Admitting: Nurse Practitioner

## 2021-05-08 ENCOUNTER — Other Ambulatory Visit (INDEPENDENT_AMBULATORY_CARE_PROVIDER_SITE_OTHER): Payer: Self-pay | Admitting: Nurse Practitioner

## 2021-05-08 ENCOUNTER — Encounter (INDEPENDENT_AMBULATORY_CARE_PROVIDER_SITE_OTHER): Payer: Self-pay | Admitting: Nurse Practitioner

## 2021-05-08 ENCOUNTER — Ambulatory Visit (INDEPENDENT_AMBULATORY_CARE_PROVIDER_SITE_OTHER): Payer: Medicare HMO

## 2021-05-08 ENCOUNTER — Ambulatory Visit (INDEPENDENT_AMBULATORY_CARE_PROVIDER_SITE_OTHER): Payer: Medicare HMO | Admitting: Nurse Practitioner

## 2021-05-08 VITALS — BP 139/81 | HR 83 | Resp 16 | Ht 67.0 in | Wt 250.0 lb

## 2021-05-08 DIAGNOSIS — M79669 Pain in unspecified lower leg: Secondary | ICD-10-CM

## 2021-05-08 DIAGNOSIS — I8312 Varicose veins of left lower extremity with inflammation: Secondary | ICD-10-CM | POA: Diagnosis not present

## 2021-05-08 DIAGNOSIS — M7989 Other specified soft tissue disorders: Secondary | ICD-10-CM

## 2021-05-08 DIAGNOSIS — I8311 Varicose veins of right lower extremity with inflammation: Secondary | ICD-10-CM

## 2021-05-08 DIAGNOSIS — I10 Essential (primary) hypertension: Secondary | ICD-10-CM | POA: Diagnosis not present

## 2021-05-08 DIAGNOSIS — E1122 Type 2 diabetes mellitus with diabetic chronic kidney disease: Secondary | ICD-10-CM | POA: Diagnosis not present

## 2021-05-08 MED ORDER — DICLOFENAC SODIUM 1 % EX GEL: 4.0000 g | Freq: Four times a day (QID) | CUTANEOUS | 3 refills | Status: DC

## 2021-05-19 ENCOUNTER — Encounter (INDEPENDENT_AMBULATORY_CARE_PROVIDER_SITE_OTHER): Payer: Self-pay | Admitting: Nurse Practitioner

## 2021-05-19 NOTE — Progress Notes (Signed)
? ?Subjective:  ? ? Patient ID: Lauren Walton, female    DOB: Oct 28, 1955, 67 y.o.   MRN: [id number] ?Chief Complaint  ?Patient presents with  ? Follow-up  ?  Ultrasound  ? ? ?Lauren Walton is a 66 year old female that presents today for follow-up with leg pain.  She notes aching and throbbing of her lower extremities.  She does have notable varicosities.  She notes that the pain is more so located in her right lower extremity near the knee area.  Previously she was not having any issues with her varicose veins and she has been very diligent with the use of her medical grade compression stockings. ? ?Today noninvasive studies show triphasic tibial artery waveforms.  Deep venous insufficiency.  There is reflux noted in the great saphenous vein at the saphenofemoral junction as well as an anterior accessory saphenous vein.  There is no evidence of DVT or superficial venous thrombosis. ? ? ?Review of Systems  ?Musculoskeletal:  Positive for arthralgias.  ?Neurological:  Positive for weakness.  ?All other systems reviewed and are negative. ? ?   ?Objective:  ? Physical Exam ?Vitals reviewed.  ?HENT:  ?   Head: Normocephalic.  ?Cardiovascular:  ?   Rate and Rhythm: Normal rate.  ?   Pulses: Decreased pulses.  ?Pulmonary:  ?   Effort: Pulmonary effort is normal.  ?Skin: ?   General: Skin is warm and dry.  ?Neurological:  ?   Mental Status: She is alert and oriented to person, place, and time.  ?   Motor: Weakness present.  ?   Gait: Gait abnormal.  ?Psychiatric:     ?   Mood and Affect: Mood normal.     ?   Behavior: Behavior normal.     ?   Thought Content: Thought content normal.     ?   Judgment: Judgment normal.  ? ? ?BP 139/81 (BP Location: Left Arm)   Pulse 83   Resp 16   Ht 5\' 7"  (1.702 m)   Wt 250 lb (113.4 kg)   BMI 39.16 kg/m?  ? ?Past Medical History:  ?Diagnosis Date  ? Diabetes mellitus without complication ([place])   ? Hypertension   ? Thyroid disease   ? ? ?Social History  ? ?Socioeconomic History  ?  Marital status: Married  ?  Spouse name: Not on file  ? Number of children: Not on file  ? Years of education: Not on file  ? Highest education level: Not on file  ?Occupational History  ? Not on file  ?Tobacco Use  ? Smoking status: Never  ? Smokeless tobacco: Never  ?Substance and Sexual Activity  ? Alcohol use: No  ? Drug use: No  ? Sexual activity: Not on file  ?Other Topics Concern  ? Not on file  ?Social History Narrative  ? Not on file  ? ?Social Determinants of Health  ? ?Financial Resource Strain: Not on file  ?Food Insecurity: Not on file  ?Transportation Needs: Not on file  ?Physical Activity: Not on file  ?Stress: Not on file  ?Social Connections: Not on file  ?Intimate Partner Violence: Not on file  ? ? ?Past Surgical History:  ?Procedure Laterality Date  ? ABDOMINAL HYSTERECTOMY    ? CHOLECYSTECTOMY    ? ? ?Family History  ?Problem Relation Age of Onset  ? Diabetes Mother   ? Heart disease Mother   ? Arthritis Mother   ? Colon cancer Father   ? ? ?Allergies  ?  Allergen Reactions  ? Ace Inhibitors Swelling  ? Penicillins Other (See Comments)  ? Percocet [Oxycodone-Acetaminophen]   ?  dizziness  ? Aspirin   ?  Gi complications  ? ? ? ?  Latest Ref Rng & Units 05/26/2016  ?  8:07 AM 11/06/2015  ?  5:38 PM 04/05/2015  ? 10:12 PM  ?CBC  ?WBC 3.6 - 11.0 K/uL 5.7   5.6   6.9    ?Hemoglobin 12.0 - 16.0 g/dL 12.4   11.4   10.8    ?Hematocrit 35.0 - 47.0 % 38.6   34.8   33.5    ?Platelets 150 - 440 K/uL 176   169   194    ? ? ? ? ?CMP  ?   ?Component Value Date/Time  ? NA 139 05/26/2016 0807  ? K 4.0 05/26/2016 0807  ? CL 104 05/26/2016 0807  ? CO2 27 05/26/2016 0807  ? GLUCOSE 123 (H) 05/26/2016 [id number]  ? BUN 19 05/26/2016 0807  ? CREATININE 0.87 05/26/2016 0807  ? CALCIUM 10.0 05/26/2016 0807  ? PROT 8.0 11/06/2015 1738  ? ALBUMIN 4.1 11/06/2015 1738  ? AST 19 11/06/2015 1738  ? ALT 13 (L) 11/06/2015 1738  ? ALKPHOS 64 11/06/2015 1738  ? BILITOT 0.4 11/06/2015 1738  ? GFRNONAA >60 05/26/2016 0807  ? GFRAA >60  05/26/2016 [id number]  ? ? ? ?No results found. ? ?   ?Assessment & Plan:  ? ?1. Varicose veins of both lower extremities with inflammation ?The patient has triphasic tibial artery waveforms bilaterally.  We discussed possible foam sclerotherapy for treatment of her accessory saphenous vein however based upon the patient's description of pain it may be more so related to knee pain than anything.  Following the discussion the patient will continue to utilize her medical grade compression socks does not wish to move forward with more invasive intervention at this time.  We will also prescribe the patient some diclofenac gel to see if this assist her with her discomfort. ?- diclofenac Sodium (VOLTAREN) 1 % GEL; Apply 4 g topically 4 (four) times daily.  Dispense: 100 g; Refill: 3 ? ?2. Type 2 diabetes mellitus with chronic kidney disease, without long-term current use of insulin, unspecified CKD stage ([place]) ?Continue hypoglycemic medications as already ordered, these medications have been reviewed and there are no changes at this time. ? ?Hgb A1C to be monitored as already arranged by primary service  ? ?3. Primary hypertension ?Continue antihypertensive medications as already ordered, these medications have been reviewed and there are no changes at this time.  ? ? ?Current Outpatient Medications on File Prior to Visit  ?Medication Sig Dispense Refill  ? acetaminophen (TYLENOL) 650 MG CR tablet Take 650 mg by mouth every 8 (eight) hours as needed for pain.    ? aspirin EC 81 MG tablet Take 81 mg by mouth daily.    ? cetirizine (ZYRTEC) 10 MG tablet Take 1 tablet by mouth daily.    ? famotidine (PEPCID) 10 MG tablet Take 10 mg by mouth 2 (two) times daily.    ? hydrochlorothiazide (HYDRODIURIL) 25 MG tablet Take 25 mg by mouth daily.    ? Levothyroxine Sodium 150 MCG CAPS Take 150 mcg by mouth daily before breakfast.     ? losartan (COZAAR) 25 MG tablet Take 25 mg by mouth daily.    ? meclizine (ANTIVERT) 32 MG tablet Take 1  tablet (32 mg total) by mouth 3 (three) times daily as needed. 30 tablet  0  ? metFORMIN (GLUCOPHAGE) 500 MG tablet Take by mouth 2 (two) times daily with a meal.    ? simvastatin (ZOCOR) 20 MG tablet Take 20 mg by mouth daily.    ? sucralfate (CARAFATE) 1 g tablet Take 1 g by mouth 4 (four) times daily -  with meals and at bedtime.    ? cyclobenzaprine (FLEXERIL) 5 MG tablet Take 1 tablet (5 mg total) by mouth daily. (Patient not taking: Reported on 05/08/2021) 15 tablet 0  ? diphenhydrAMINE (BENADRYL) 25 MG tablet Take 25 mg by mouth every 6 (six) hours as needed. (Patient not taking: Reported on 05/08/2021)    ? ibuprofen (ADVIL,MOTRIN) 800 MG tablet Take 1 tablet (800 mg total) by mouth every 8 (eight) hours as needed for moderate pain. (Patient not taking: Reported on 05/08/2021) 15 tablet 0  ? omeprazole (PRILOSEC) 40 MG capsule Take by mouth.    ? traMADol (ULTRAM) 50 MG tablet Take 50 mg by mouth every 6 (six) hours as needed. (Patient not taking: Reported on 05/08/2021)    ? Vitamin D, Ergocalciferol, (DRISDOL) 50000 UNITS CAPS capsule Take 50,000 Units by mouth every 7 (seven) days. (Patient not taking: Reported on 05/08/2021)    ? ?No current facility-administered medications on file prior to visit.  ? ? ?There are no Patient Instructions on file for this visit. ?No follow-ups on file. ? ? ?Kris Hartmann, NP ? ? ?

## 2021-05-31 ENCOUNTER — Ambulatory Visit
Admission: RE | Admit: 2021-05-31 | Discharge: 2021-05-31 | Disposition: A | Discharge status: Home / Self Care | Payer: Medicare HMO | Source: Ambulatory Visit | Attending: Nurse Practitioner | Admitting: Nurse Practitioner

## 2021-05-31 DIAGNOSIS — Z1231 Encounter for screening mammogram for malignant neoplasm of breast: Secondary | ICD-10-CM | POA: Insufficient documentation

## 2021-06-04 ENCOUNTER — Inpatient Hospital Stay
Admission: RE | Admit: 2021-06-04 | Discharge: 2021-06-04 | Disposition: A | Discharge status: Home / Self Care | Payer: Self-pay | Source: Ambulatory Visit | Attending: *Deleted | Admitting: *Deleted

## 2021-06-04 ENCOUNTER — Other Ambulatory Visit: Payer: Self-pay | Admitting: *Deleted

## 2021-06-04 DIAGNOSIS — Z1231 Encounter for screening mammogram for malignant neoplasm of breast: Secondary | ICD-10-CM

## 2021-12-03 ENCOUNTER — Encounter (INDEPENDENT_AMBULATORY_CARE_PROVIDER_SITE_OTHER): Payer: Self-pay

## 2022-03-08 ENCOUNTER — Other Ambulatory Visit: Payer: Self-pay

## 2022-03-08 DIAGNOSIS — Z1231 Encounter for screening mammogram for malignant neoplasm of breast: Secondary | ICD-10-CM

## 2022-07-04 ENCOUNTER — Ambulatory Visit
Admission: RE | Admit: 2022-07-04 | Discharge: 2022-07-04 | Disposition: A | Discharge status: Home / Self Care | Payer: Medicare HMO | Source: Ambulatory Visit | Attending: Nurse Practitioner | Admitting: Nurse Practitioner

## 2022-07-04 DIAGNOSIS — Z1231 Encounter for screening mammogram for malignant neoplasm of breast: Secondary | ICD-10-CM | POA: Diagnosis present

## 2022-12-16 ENCOUNTER — Emergency Department
Admission: EM | Admit: 2022-12-16 | Discharge: 2022-12-16 | Disposition: A | Discharge status: Home / Self Care | Payer: Medicare HMO | Attending: Emergency Medicine | Admitting: Emergency Medicine

## 2022-12-16 ENCOUNTER — Emergency Department: Payer: Medicare HMO

## 2022-12-16 ENCOUNTER — Other Ambulatory Visit: Payer: Self-pay

## 2022-12-16 DIAGNOSIS — I10 Essential (primary) hypertension: Secondary | ICD-10-CM | POA: Insufficient documentation

## 2022-12-16 DIAGNOSIS — R197 Diarrhea, unspecified: Secondary | ICD-10-CM | POA: Diagnosis present

## 2022-12-16 DIAGNOSIS — E119 Type 2 diabetes mellitus without complications: Secondary | ICD-10-CM | POA: Insufficient documentation

## 2022-12-16 DIAGNOSIS — R109 Unspecified abdominal pain: Secondary | ICD-10-CM | POA: Diagnosis not present

## 2022-12-16 LAB — COMPREHENSIVE METABOLIC PANEL
ALT: 16 U/L (ref 0–44)
AST: 19 U/L (ref 15–41)
Albumin: 4.3 g/dL (ref 3.5–5.0)
Alkaline Phosphatase: 64 U/L (ref 38–126)
Anion gap: 8 (ref 5–15)
BUN: 27 mg/dL — ABNORMAL HIGH (ref 8–23)
CO2: 24 mmol/L (ref 22–32)
Calcium: 9.8 mg/dL (ref 8.9–10.3)
Chloride: 106 mmol/L (ref 98–111)
Creatinine, Ser: 0.92 mg/dL (ref 0.44–1.00)
GFR, Estimated: >60 mL/min (ref >60)
Glucose, Bld: 127 mg/dL — ABNORMAL HIGH (ref 70–99)
Potassium: 3.9 mmol/L (ref 3.5–5.1)
Sodium: 138 mmol/L (ref 135–145)
Total Bilirubin: 0.3 mg/dL (ref <1.2)
Total Protein: 8.4 g/dL — ABNORMAL HIGH (ref 6.5–8.1)

## 2022-12-16 LAB — CBC
HCT: 38.6 % (ref 36.0–46.0)
Hemoglobin: 11.8 g/dL — ABNORMAL LOW (ref 12.0–15.0)
MCH: 26.6 pg (ref 26.0–34.0)
MCHC: 30.6 g/dL (ref 30.0–36.0)
MCV: 86.9 fL (ref 80.0–100.0)
Platelets: 210 10*3/uL (ref 150–400)
RBC: 4.44 MIL/uL (ref 3.87–5.11)
RDW: 15.9 % — ABNORMAL HIGH (ref 11.5–15.5)
WBC: 5.9 10*3/uL (ref 4.0–10.5)
nRBC: 0 % (ref 0.0–0.2)

## 2022-12-16 LAB — URINALYSIS, ROUTINE W REFLEX MICROSCOPIC
Bacteria, UA: NONE SEEN
Bilirubin Urine: NEGATIVE
Glucose, UA: NEGATIVE mg/dL
Hgb urine dipstick: NEGATIVE
Ketones, ur: NEGATIVE mg/dL
Nitrite: NEGATIVE
Protein, ur: NEGATIVE mg/dL
Specific Gravity, Urine: 1.012 (ref 1.005–1.030)
pH: 6 (ref 5.0–8.0)

## 2022-12-16 LAB — LIPASE, BLOOD: Lipase: 44 U/L (ref 11–51)

## 2022-12-16 MED ORDER — IOHEXOL 350 MG/ML SOLN
100.0000 mL | Freq: Once | INTRAVENOUS | Status: AC | PRN
  Administered 2022-12-16: 100 mL via INTRAVENOUS

## 2022-12-16 NOTE — ED Triage Notes (Signed)
Pt comes with c/o diarrhea that started las [facility]. Pt states it went away and then came back yesterday. Pt states little pain in belly

## 2022-12-16 NOTE — ED Provider Notes (Signed)
  Physical Exam  BP (!) 150/80 (BP Location: Right Arm)   Pulse 80   Temp 98 F (36.7 C) (Oral)   Resp 18   Ht 5\' 7"  (1.702 m)   Wt 118.8 kg   SpO2 100%   BMI 41.04 kg/m   Physical Exam  Procedures  Procedures  ED Course / MDM    Medical Decision Making Amount and/or Complexity of Data Reviewed Labs: ordered. Radiology: ordered.  Risk Prescription drug management.   Receiving care of patient from [facility], PA-C, plan is to review CT results.  If negative discharged to home.  Did independently review interpret the CT reading, appears to be adorable.  No concerning findings for diverticulitis etc.  I did explain findings to patient.  States feels well.  Gave her reassurance that she needs to use over-the-counter Imodium or Pepto-Bismol.  Also eat a bland diet and slowly introduce regular foods into her diet.  She is in agreement with treatment plan.  Discharged stable condition.  Strict instructions to return for worsening       Faythe Ghee, PA-C 12/16/22 1616    Corena Herter, MD 12/17/22 [phone]

## 2022-12-16 NOTE — Discharge Instructions (Addendum)
Your bloodwork and CT scan were normal. Please follow up with your outpatient provider. Please return for new, worsening, or change in symptoms or other concerns. It was a pleasure caring for you today.

## 2022-12-16 NOTE — ED Provider Notes (Signed)
[facility] Provider Note    Event Date/Time   First MD Initiated Contact with Patient 12/16/22 1102     (approximate)   History   Diarrhea   HPI  Lauren Walton is a 67 y.o. female with a past medical history of morbid obesity, type 2 diabetes for evaluation of loose stools that have been intermittent since Wednesday and "a little bit" of abdominal pain.  Patient denies any blood in her stool.  She reports that she took Pepto-Bismol with improvement of her symptoms.  She has not had any nausea or vomiting.  No fevers or chills.  No recent antibiotics.  She still eating and drinking normally.  Patient Active Problem List   Diagnosis Date Noted   Pain and swelling of lower leg 06/02/2020   Varicose veins of both lower extremities with inflammation 05/31/2020   Diabetes (HCC) 05/31/2020   Osteoarthritis of knee 08/11/2017   Constipation 09/20/2016   Hyperlipemia, mixed 04/16/2016   Primary hypertension 04/16/2016   Neck pain 04/16/2016   Need for prophylactic vaccination against Streptococcus pneumoniae (pneumococcus) 04/16/2016   Cervical radiculitis 04/02/2016   Shoulder pain 04/02/2016   Acquired hypothyroidism 03/07/2016   Breast cancer screening 03/07/2016   Morbid (severe) obesity due to excess calories (HCC) 03/07/2016   Thrombocytopenia (HCC) 03/07/2016   Type 2 diabetes mellitus without complication (HCC) 03/07/2016          Physical Exam   Triage Vital Signs: ED Triage Vitals [12/16/22 1042]  Encounter Vitals Group     BP (!) 155/82     Systolic BP Percentile      Diastolic BP Percentile      Pulse Rate 83     Resp 18     Temp 97.7 F (36.5 C)     Temp src      SpO2 100 %     Weight 262 lb (118.8 kg)     Height 5\' 7"  (1.702 m)     Head Circumference      Peak Flow      Pain Score 5     Pain Loc      Pain Education      Exclude from Growth Chart     Most recent vital signs: Vitals:   12/16/22 1042  BP: (!) 155/82   Pulse: 83  Resp: 18  Temp: 97.7 F (36.5 C)  SpO2: 100%    Physical Exam Vitals and nursing note reviewed.  Constitutional:      General: Awake and alert. No acute distress.    Appearance: Normal appearance. The patient is obese.  HENT:     Head: Normocephalic and atraumatic.     Mouth: Mucous membranes are moist.  Eyes:     General: PERRL. Normal EOMs        Right eye: No discharge.        Left eye: No discharge.     Conjunctiva/sclera: Conjunctivae normal.  Cardiovascular:     Rate and Rhythm: Normal rate and regular rhythm.     Pulses: Normal pulses.  Pulmonary:     Effort: Pulmonary effort is normal. No respiratory distress.     Breath sounds: Normal breath sounds.  Abdominal:     Abdomen is soft. There is no abdominal tenderness. No rebound or guarding. No distention. Musculoskeletal:        General: No swelling. Normal range of motion.     Cervical back: Normal range of motion and neck supple.  Skin:    General: Skin is warm and dry.     Capillary Refill: Capillary refill takes less than 2 seconds.     Findings: No rash.  Neurological:     Mental Status: The patient is awake and alert.      ED Results / Procedures / Treatments   Labs (all labs ordered are listed, but only abnormal results are displayed) Labs Reviewed  COMPREHENSIVE METABOLIC PANEL - Abnormal; Notable for the following components:      Result Value   Glucose, Bld 127 (*)    BUN 27 (*)    Total Protein 8.4 (*)    All other components within normal limits  CBC - Abnormal; Notable for the following components:   Hemoglobin 11.8 (*)    RDW 15.9 (*)    All other components within normal limits  URINALYSIS, ROUTINE W REFLEX MICROSCOPIC - Abnormal; Notable for the following components:   Color, Urine STRAW (*)    APPearance CLEAR (*)    Leukocytes,Ua TRACE (*)    All other components within normal limits  LIPASE, BLOOD     EKG     RADIOLOGY     PROCEDURES:  Critical Care  performed:   Procedures   MEDICATIONS ORDERED IN ED: Medications  iohexol (OMNIPAQUE) 350 MG/ML injection 100 mL (100 mLs Intravenous Contrast Given 12/16/22 1229)     IMPRESSION / MDM / ASSESSMENT AND PLAN / ED COURSE  I reviewed the triage vital signs and the nursing notes.   Differential diagnosis includes, but is not limited to, diverticulitis, gastroenteritis, dehydration, electrolyte disarray.  Patient is awake and alert, hemodynamically stable and afebrile.  Labs are obtained and are overall reassuring.  CT scan obtained for evaluation of possible diverticulitis.  She was unable to provide a stool sample in the emergency department.  Patient is overall well-appearing.  Vomiting and diarrhea are non-bloody. Patient is hemodynamically stable. No history of immunosuppression, no other red flags such as recent travel, sick contacts or recent antibiotic use. Improved with treatment in the emergency department and tolerating oral intake. Differential diagnosis is broad however without given improvement and normal labs, low suspicion that the patient has surgical process in abdomen though CT scan was obtained out of abundance of caution. Given that patient has a paucity of red flags for the vomiting and diarrhea as it pertains to patient's past medical history and history of present illness, no indication for further observation or empiric antibiotic treatment.   Passed off to S. Fisher, PA-C pending CT results and final disposition.    Patient's presentation is most consistent with acute complicated illness / injury requiring diagnostic workup.    FINAL CLINICAL IMPRESSION(S) / ED DIAGNOSES   Final diagnoses:  Diarrhea, unspecified type     Rx / DC Orders   ED Discharge Orders     None        Note:  This document was prepared using Dragon voice recognition software and may include unintentional dictation errors.   Jackelyn Hoehn, PA-C 12/16/22 1500    Chesley Noon, MD 12/16/22 1513

## 2022-12-23 ENCOUNTER — Telehealth: Payer: Self-pay

## 2022-12-23 NOTE — Telephone Encounter (Signed)
Patient called we missed the call. I called the patient back there was no answer I left the patient a voicemail to call back.

## 2023-04-10 ENCOUNTER — Other Ambulatory Visit: Payer: Self-pay | Admitting: Nurse Practitioner

## 2023-04-10 DIAGNOSIS — Z1231 Encounter for screening mammogram for malignant neoplasm of breast: Secondary | ICD-10-CM

## 2023-05-05 ENCOUNTER — Other Ambulatory Visit: Payer: Self-pay

## 2023-05-05 DIAGNOSIS — I10 Essential (primary) hypertension: Secondary | ICD-10-CM | POA: Insufficient documentation

## 2023-05-05 DIAGNOSIS — Z7982 Long term (current) use of aspirin: Secondary | ICD-10-CM | POA: Insufficient documentation

## 2023-05-05 DIAGNOSIS — Z7984 Long term (current) use of oral hypoglycemic drugs: Secondary | ICD-10-CM | POA: Insufficient documentation

## 2023-05-05 DIAGNOSIS — E039 Hypothyroidism, unspecified: Secondary | ICD-10-CM | POA: Insufficient documentation

## 2023-05-05 DIAGNOSIS — E119 Type 2 diabetes mellitus without complications: Secondary | ICD-10-CM | POA: Diagnosis not present

## 2023-05-05 DIAGNOSIS — Z79899 Other long term (current) drug therapy: Secondary | ICD-10-CM | POA: Insufficient documentation

## 2023-05-05 DIAGNOSIS — L0231 Cutaneous abscess of buttock: Secondary | ICD-10-CM | POA: Diagnosis present

## 2023-05-05 NOTE — ED Triage Notes (Signed)
 POV with CC of abscess on top of butt crack x3 days with drainage.

## 2023-05-06 ENCOUNTER — Emergency Department
Admission: EM | Admit: 2023-05-06 | Discharge: 2023-05-06 | Disposition: A | Discharge status: Home / Self Care | Attending: Emergency Medicine | Admitting: Emergency Medicine

## 2023-05-06 DIAGNOSIS — L0291 Cutaneous abscess, unspecified: Secondary | ICD-10-CM

## 2023-05-06 DIAGNOSIS — L0231 Cutaneous abscess of buttock: Secondary | ICD-10-CM | POA: Diagnosis not present

## 2023-05-06 MED ORDER — DOXYCYCLINE HYCLATE 100 MG PO TABS
100.0000 mg | ORAL_TABLET | Freq: Once | ORAL | Status: AC
  Administered 2023-05-06: 100 mg via ORAL
  Filled 2023-05-06: qty 1

## 2023-05-06 MED ORDER — DOXYCYCLINE HYCLATE 50 MG PO CAPS: 100.0000 mg | ORAL_CAPSULE | Freq: Two times a day (BID) | ORAL | 0 refills | Status: DC

## 2023-05-06 MED ORDER — TRAMADOL HCL 50 MG PO TABS: 50.0000 mg | ORAL_TABLET | Freq: Four times a day (QID) | ORAL | 0 refills | Status: DC | PRN

## 2023-05-06 NOTE — ED Provider Notes (Signed)
 [facility] Provider Note    Event Date/Time   First MD Initiated Contact with Patient 05/06/23 [phone]     (approximate)   History   Abscess   HPI  Lauren Walton is a 68 y.o. female who presents to the ED from home with a chief complaint of draining abscess to buttocks x 3 days.  Denies associated fever/chills, abdominal pain, nausea/vomiting or malaise.  States area began to drain while patient was in the ED waiting room.     Past Medical History   Past Medical History:  Diagnosis Date   Diabetes mellitus without complication (HCC)    Hypertension    Thyroid disease      Active Problem List   Patient Active Problem List   Diagnosis Date Noted   Pain and swelling of lower leg 06/02/2020   Varicose veins of both lower extremities with inflammation 05/31/2020   Diabetes (HCC) 05/31/2020   Osteoarthritis of knee 08/11/2017   Constipation 09/20/2016   Hyperlipemia, mixed 04/16/2016   Primary hypertension 04/16/2016   Neck pain 04/16/2016   Need for prophylactic vaccination against Streptococcus pneumoniae (pneumococcus) 04/16/2016   Cervical radiculitis 04/02/2016   Shoulder pain 04/02/2016   Acquired hypothyroidism 03/07/2016   Breast cancer screening 03/07/2016   Morbid (severe) obesity due to excess calories (HCC) 03/07/2016   Thrombocytopenia (HCC) 03/07/2016   Type 2 diabetes mellitus without complication (HCC) 03/07/2016     Past Surgical History   Past Surgical History:  Procedure Laterality Date   ABDOMINAL HYSTERECTOMY     CHOLECYSTECTOMY       Home Medications   Prior to Admission medications   Medication Sig Start Date End Date Taking? Authorizing Provider  doxycycline (VIBRAMYCIN) 50 MG capsule Take 2 capsules (100 mg total) by mouth 2 (two) times daily. 05/06/23  Yes Irean Hong, MD  traMADol (ULTRAM) 50 MG tablet Take 1 tablet (50 mg total) by mouth every 6 (six) hours as needed. 05/06/23  Yes Irean Hong, MD   acetaminophen (TYLENOL) 650 MG CR tablet Take 650 mg by mouth every 8 (eight) hours as needed for pain.    [provider]  aspirin EC 81 MG tablet Take 81 mg by mouth daily.    [provider]  cetirizine (ZYRTEC) 10 MG tablet Take 1 tablet by mouth daily. 08/13/19   [provider]  diclofenac Sodium (VOLTAREN) 1 % GEL Apply 4 g topically 4 (four) times daily. 05/08/21   Georgiana Spinner, NP  famotidine (PEPCID) 10 MG tablet Take 10 mg by mouth 2 (two) times daily.    [provider]  hydrochlorothiazide (HYDRODIURIL) 25 MG tablet Take 25 mg by mouth daily.    [provider]  Levothyroxine Sodium 150 MCG CAPS Take 150 mcg by mouth daily before breakfast.     [provider]  losartan (COZAAR) 25 MG tablet Take 25 mg by mouth daily.    [provider]  meclizine (ANTIVERT) 32 MG tablet Take 1 tablet (32 mg total) by mouth 3 (three) times daily as needed. 05/26/16   Arnaldo Natal, MD  metFORMIN (GLUCOPHAGE) 500 MG tablet Take by mouth 2 (two) times daily with a meal.    [provider]  omeprazole (PRILOSEC) 40 MG capsule Take by mouth. 01/04/20 01/03/21  [provider]  simvastatin (ZOCOR) 20 MG tablet Take 20 mg by mouth daily.    [provider]  sucralfate (CARAFATE) 1 g tablet Take 1  g by mouth 4 (four) times daily -  with meals and at bedtime.    [provider]     Allergies  Ace inhibitors, Penicillins, Percocet [oxycodone-acetaminophen], and Aspirin   Family History   Family History  Problem Relation Age of Onset   Diabetes Mother    Heart disease Mother    Arthritis Mother    Colon cancer Father    Breast cancer Cousin      Physical Exam  Triage Vital Signs: ED Triage Vitals  Encounter Vitals Group     BP 05/05/23 2319 (!) 149/73     Systolic BP Percentile --      Diastolic BP Percentile --      Pulse Rate 05/05/23 2319 93     Resp 05/05/23 2319 18     Temp 05/05/23  2319 98.6 F (37 C)     Temp Source 05/05/23 2319 Oral     SpO2 05/05/23 2319 98 %     Weight 05/05/23 2321 259 lb (117.5 kg)     Height 05/05/23 2321 5\' 7"  (1.702 m)     Head Circumference --      Peak Flow --      Pain Score 05/05/23 2328 4     Pain Loc --      Pain Education --      Exclude from Growth Chart --     Updated Vital Signs: BP 119/74   Pulse 82   Temp 98.6 F (37 C) (Oral)   Resp 18   Ht 5\' 7"  (1.702 m)   Wt 117.5 kg   SpO2 100%   BMI 40.57 kg/m    General: Awake, no distress.  CV:  RRR.  Good peripheral perfusion.  Resp:  Normal effort.  CTAB. Abd:  Obese, nontender.  No distention.  Other:  Small draining abscess to top center of buttocks with surrounding induration.  No warmth/erythema/fluctuance.   ED Results / Procedures / Treatments  Labs (all labs ordered are listed, but only abnormal results are displayed) Labs Reviewed - No data to display   EKG  None   RADIOLOGY None   Official radiology report(s): No results found.   PROCEDURES:  Critical Care performed: No  Procedures   MEDICATIONS ORDERED IN ED: Medications  doxycycline (VIBRA-TABS) tablet 100 mg (has no administration in time range)     IMPRESSION / MDM / ASSESSMENT AND PLAN / ED COURSE  I reviewed the triage vital signs and the nursing notes.                             68 year old female presenting with draining abscess.  Will start Doxycycline, advised sitz bath's or warm compresses and close follow-up with her PCP.  Strict return precautions given.  Patient and family member verbalized understanding and agree with plan of care.  Patient's presentation is most consistent with acute, uncomplicated illness.    FINAL CLINICAL IMPRESSION(S) / ED DIAGNOSES   Final diagnoses:  Abscess     Rx / DC Orders   ED Discharge Orders          Ordered    doxycycline (VIBRAMYCIN) 50 MG capsule  2 times daily        05/06/23 0616    traMADol (ULTRAM) 50 MG tablet   Every 6 hours PRN        05/06/23 0616  Note:  This document was prepared using Dragon voice recognition software and may include unintentional dictation errors.   Irean Hong, MD 05/06/23 [phone]

## 2023-05-06 NOTE — Discharge Instructions (Signed)
 Take and finish antibiotic as prescribed.  You may take pain medicine as needed.  Apply warm compresses several times daily.  Return to the ER for worsening symptoms, persistent vomiting, fever or other concerns.

## 2023-06-12 ENCOUNTER — Ambulatory Visit: Admitting: Podiatry

## 2023-06-17 LAB — OPHTHALMOLOGY REPORT-SCANNED

## 2023-06-19 ENCOUNTER — Ambulatory Visit: Admitting: Podiatry

## 2023-06-26 ENCOUNTER — Ambulatory Visit: Admitting: Podiatry

## 2023-07-04 ENCOUNTER — Other Ambulatory Visit: Payer: Self-pay | Admitting: Family Medicine

## 2023-07-04 ENCOUNTER — Other Ambulatory Visit: Payer: Self-pay

## 2023-07-04 DIAGNOSIS — Z1231 Encounter for screening mammogram for malignant neoplasm of breast: Secondary | ICD-10-CM

## 2023-07-08 ENCOUNTER — Encounter

## 2023-07-15 ENCOUNTER — Encounter

## 2023-07-17 ENCOUNTER — Ambulatory Visit: Admitting: Podiatry

## 2023-07-22 ENCOUNTER — Ambulatory Visit
Admission: RE | Admit: 2023-07-22 | Discharge: 2023-07-22 | Disposition: A | Discharge status: Home / Self Care | Source: Ambulatory Visit | Attending: Nurse Practitioner | Admitting: Nurse Practitioner

## 2023-07-22 DIAGNOSIS — Z1231 Encounter for screening mammogram for malignant neoplasm of breast: Secondary | ICD-10-CM | POA: Insufficient documentation

## 2023-07-31 ENCOUNTER — Ambulatory Visit: Admitting: Podiatry

## 2023-08-03 ENCOUNTER — Emergency Department

## 2023-08-03 ENCOUNTER — Emergency Department
Admission: EM | Admit: 2023-08-03 | Discharge: 2023-08-03 | Disposition: A | Discharge status: Home / Self Care | Attending: Emergency Medicine | Admitting: Emergency Medicine

## 2023-08-03 ENCOUNTER — Other Ambulatory Visit: Payer: Self-pay

## 2023-08-03 ENCOUNTER — Encounter: Payer: Self-pay | Admitting: Emergency Medicine

## 2023-08-03 DIAGNOSIS — R42 Dizziness and giddiness: Secondary | ICD-10-CM | POA: Diagnosis present

## 2023-08-03 DIAGNOSIS — E039 Hypothyroidism, unspecified: Secondary | ICD-10-CM | POA: Diagnosis not present

## 2023-08-03 DIAGNOSIS — R519 Headache, unspecified: Secondary | ICD-10-CM | POA: Diagnosis not present

## 2023-08-03 DIAGNOSIS — E119 Type 2 diabetes mellitus without complications: Secondary | ICD-10-CM | POA: Diagnosis not present

## 2023-08-03 DIAGNOSIS — I10 Essential (primary) hypertension: Secondary | ICD-10-CM | POA: Insufficient documentation

## 2023-08-03 LAB — BASIC METABOLIC PANEL WITH GFR
Anion gap: 9 (ref 5–15)
BUN: 23 mg/dL (ref 8–23)
CO2: 23 mmol/L (ref 22–32)
Calcium: 9.6 mg/dL (ref 8.9–10.3)
Chloride: 107 mmol/L (ref 98–111)
Creatinine, Ser: 0.89 mg/dL (ref 0.44–1.00)
GFR, Estimated: >60 mL/min (ref >60)
Glucose, Bld: 113 mg/dL — ABNORMAL HIGH (ref 70–99)
Potassium: 3.9 mmol/L (ref 3.5–5.1)
Sodium: 139 mmol/L (ref 135–145)

## 2023-08-03 LAB — CBC
HCT: 35.9 % — ABNORMAL LOW (ref 36.0–46.0)
Hemoglobin: 10.8 g/dL — ABNORMAL LOW (ref 12.0–15.0)
MCH: 26.1 pg (ref 26.0–34.0)
MCHC: 30.1 g/dL (ref 30.0–36.0)
MCV: 86.7 fL (ref 80.0–100.0)
Platelets: 180 10*3/uL (ref 150–400)
RBC: 4.14 MIL/uL (ref 3.87–5.11)
RDW: 16.8 % — ABNORMAL HIGH (ref 11.5–15.5)
WBC: 5.4 10*3/uL (ref 4.0–10.5)
nRBC: 0 % (ref 0.0–0.2)

## 2023-08-03 LAB — TROPONIN I (HIGH SENSITIVITY): Troponin I (High Sensitivity): <2 ng/L (ref <18)

## 2023-08-03 MED ORDER — MECLIZINE HCL 12.5 MG PO TABS: 12.5000 mg | ORAL_TABLET | Freq: Three times a day (TID) | ORAL | 0 refills | Status: AC | PRN

## 2023-08-03 MED ORDER — ACETAMINOPHEN 500 MG PO TABS
1000.0000 mg | ORAL_TABLET | Freq: Once | ORAL | Status: AC
  Administered 2023-08-03: 1000 mg via ORAL
  Filled 2023-08-03: qty 2

## 2023-08-03 MED ORDER — MECLIZINE HCL 25 MG PO TABS
25.0000 mg | ORAL_TABLET | Freq: Once | ORAL | Status: AC
  Administered 2023-08-03: 25 mg via ORAL
  Filled 2023-08-03: qty 1

## 2023-08-03 NOTE — ED Provider Notes (Signed)
 [facility] Provider Note    Event Date/Time   First MD Initiated Contact with Patient 08/03/23 [phone]     (approximate)   History   Chief Complaint Dizziness   HPI  Lauren Walton is a 68 y.o. female with a past medical history of hypertension, diabetes, and hypothyroidism who presents to the ED complaining of headache and dizziness.  Patient reports that she has been feeling dizzy for the past 5 days, which she describes as a feeling of the room spinning around her.  She states that symptoms do not seem to be worse with movement and sometimes feel like they are worse when she is sitting still.  She denies any associated vision changes, speech changes, numbness, or weakness.  She does report a frontal headache starting earlier this morning, also deals with chronic pain in her neck which she states is no worse than usual today.  She has not had any nausea or vomiting, denies chest pain or shortness of breath.     Physical Exam   Triage Vital Signs: ED Triage Vitals [08/03/23 0642]  Encounter Vitals Group     BP 133/68     Girls Systolic BP Percentile      Girls Diastolic BP Percentile      Boys Systolic BP Percentile      Boys Diastolic BP Percentile      Pulse Rate 71     Resp 18     Temp 97.7 F (36.5 C)     Temp Source Oral     SpO2 100 %     Weight      Height      Head Circumference      Peak Flow      Pain Score 7     Pain Loc      Pain Education      Exclude from Growth Chart     Most recent vital signs: Vitals:   08/03/23 0642  BP: 133/68  Pulse: 71  Resp: 18  Temp: 97.7 F (36.5 C)  SpO2: 100%    Constitutional: Alert and oriented. Eyes: Conjunctivae are normal. Head: Atraumatic. Nose: No congestion/rhinnorhea. Mouth/Throat: Mucous membranes are moist.  Neck: Supple with no meningismus. Cardiovascular: Normal rate, regular rhythm. Grossly normal heart sounds.  2+ radial pulses bilaterally. Respiratory: Normal respiratory  effort.  No retractions. Lungs CTAB. Gastrointestinal: Soft and nontender. No distention. Musculoskeletal: No lower extremity tenderness nor edema.  Neurologic:  Normal speech and language. No gross focal neurologic deficits are appreciated.    ED Results / Procedures / Treatments   Labs (all labs ordered are listed, but only abnormal results are displayed) Labs Reviewed  CBC - Abnormal; Notable for the following components:      Result Value   Hemoglobin 10.8 (*)    HCT 35.9 (*)    RDW 16.8 (*)    All other components within normal limits  BASIC METABOLIC PANEL WITH GFR - Abnormal; Notable for the following components:   Glucose, Bld 113 (*)    All other components within normal limits  TROPONIN I (HIGH SENSITIVITY)     EKG  ED ECG REPORT I, Carlin Palin, the attending physician, personally viewed and interpreted this ECG.   Date: 08/03/2023  EKG Time: 6:55  Rate: 70  Rhythm: normal sinus rhythm  Axis: Normal  Intervals:first-degree A-V block   ST&T Change: None  RADIOLOGY CT head reviewed and interpreted by me with no hemorrhage or midline shift.  PROCEDURES:  Critical Care performed: No  Procedures   MEDICATIONS ORDERED IN ED: Medications  acetaminophen (TYLENOL) tablet 1,000 mg (1,000 mg Oral Given 08/03/23 0756)  meclizine  (ANTIVERT ) tablet 25 mg (25 mg Oral Given 08/03/23 0756)     IMPRESSION / MDM / ASSESSMENT AND PLAN / ED COURSE  I reviewed the triage vital signs and the nursing notes.                              68 y.o. female with past medical history of hypertension, diabetes, and hypothyroidism who presents to the ED complaining of 5 days of dizziness with headache since this morning.  Patient's presentation is most consistent with acute presentation with potential threat to life or bodily function.  Differential diagnosis includes, but is not limited to, SAH, meningitis, tension headache, stroke, TIA, anemia, electrolyte abnormality, AKI,  arrhythmia, migraine headache.  Patient nontoxic-appearing and in no acute distress, vital signs are unremarkable.  Would consider stroke given her description of dizziness, but she has had 5 days of symptoms and is outside the window for acute intervention.  We will further assess with CT head, no focal neurologic deficits noted on exam and we will treat with meclizine .  EKG shows no evidence of arrhythmia or ischemia, labs are reassuring with no significant anemia, leukocytosis, electrolyte abnormality, or AKI.  Troponin also within normal limits.  CT head is negative for acute process, MRI brain was also performed and negative for stroke or other acute finding.  Patient reports feeling better on reassessment with resolution of her dizziness and headache following Tylenol and meclizine .  She is ambulatory without difficulty and appropriate for discharge home with outpatient PCP follow-up.  She was counseled to return to the ED for new or worsening symptoms, patient agrees with plan.      FINAL CLINICAL IMPRESSION(S) / ED DIAGNOSES   Final diagnoses:  Dizziness  Acute nonintractable headache, unspecified headache type     Rx / DC Orders   ED Discharge Orders          Ordered    meclizine  (ANTIVERT ) 12.5 MG tablet  3 times daily PRN        08/03/23 1040             Note:  This document was prepared using Dragon voice recognition software and may include unintentional dictation errors.   Willo Dunnings, MD 08/03/23 1041

## 2023-08-03 NOTE — ED Triage Notes (Signed)
 Pt reports neck and head pain since Tuesday w/ intermittent dizziness. Denies n/v/cp or sob. Pt ambulatory to triage,gait steady, NADN.

## 2023-08-14 ENCOUNTER — Encounter: Attending: Family Medicine | Admitting: Dietician

## 2023-08-14 ENCOUNTER — Encounter: Payer: Self-pay | Admitting: Dietician

## 2023-08-14 DIAGNOSIS — E119 Type 2 diabetes mellitus without complications: Secondary | ICD-10-CM

## 2023-08-14 DIAGNOSIS — Z713 Dietary counseling and surveillance: Secondary | ICD-10-CM | POA: Diagnosis not present

## 2023-08-14 DIAGNOSIS — E1159 Type 2 diabetes mellitus with other circulatory complications: Secondary | ICD-10-CM | POA: Insufficient documentation

## 2023-08-14 NOTE — Progress Notes (Signed)
 Patient was seen on 08/14/2023 for the first of a series of three diabetes self-management courses at the [facility].  Patient Education Plan per assessed needs and concerns is to attend three course education program for Diabetes Self Management Education.  A1C was 6.2% on 07/29/2023.  The following learning objectives were met by the patient during this class: Describe diabetes, types of diabetes and pathophysiology State some common risk factors for diabetes Defines the role of glucose and insulin Describe the relationship between diabetes and cardiovascular and other risks State the members of the Healthcare Team States the rationale for glucose monitoring and when to test State their individual Target Range State the importance of logging glucose readings and how to interpret the readings Identifies A1C target Explain the correlation between A1c and eAG values State symptoms and treatment of high blood glucose and low blood glucose Explain proper technique for glucose testing and identify proper sharps disposal  Handouts given during class include: How to Thrive:  A Guide for Your Journey with Diabetes by the ADA Meal Plan Card and carbohydrate content list Dietary intake form Low Sodium Flavoring Tips Types of Fats Dining Out Label reading Snack list The diabetes portion plate Diabetes Resources A1c to eAG Conversion Chart Blood Glucose Log Diabetes Recommended Care Schedule Support Group Diabetes Success Plan Core Class Satisfaction Survey   Follow-Up Plan: Attend core 2

## 2023-08-21 ENCOUNTER — Encounter: Payer: Self-pay | Admitting: Dietician

## 2023-08-21 ENCOUNTER — Encounter: Admitting: Dietician

## 2023-08-21 DIAGNOSIS — E119 Type 2 diabetes mellitus without complications: Secondary | ICD-10-CM

## 2023-08-21 DIAGNOSIS — E1159 Type 2 diabetes mellitus with other circulatory complications: Secondary | ICD-10-CM | POA: Diagnosis not present

## 2023-08-21 NOTE — Progress Notes (Signed)
 Patient was seen on 08/21/2023 for the second of a series of three diabetes self-management courses at the [facility]. The following learning objectives were met by the patient during this class:  Describe the role of different macronutrients on glucose Explain how carbohydrates affect blood glucose State what foods contain the most carbohydrates Demonstrate carbohydrate counting Demonstrate how to read Nutrition Facts food label Describe effects of various fats on heart health Describe the importance of good nutrition for health and healthy eating strategies Describe techniques for managing your shopping, cooking and meal planning List strategies to follow meal plan when dining out Describe the effects of alcohol on glucose and how to use it safely  Goals:  Follow Diabetes Meal Plan as instructed  Aim to spread carbs evenly throughout the day  Aim for 3 meals per day and snacks as needed Include lean protein foods to meals/snacks  Monitor glucose levels as instructed by your doctor   Follow-Up Plan: Attend Core 3 Work towards following your personal food plan.

## 2023-08-26 ENCOUNTER — Ambulatory Visit (INDEPENDENT_AMBULATORY_CARE_PROVIDER_SITE_OTHER): Admitting: Podiatry

## 2023-08-26 DIAGNOSIS — B351 Tinea unguium: Secondary | ICD-10-CM | POA: Diagnosis not present

## 2023-08-26 DIAGNOSIS — M79674 Pain in right toe(s): Secondary | ICD-10-CM | POA: Diagnosis not present

## 2023-08-26 DIAGNOSIS — E119 Type 2 diabetes mellitus without complications: Secondary | ICD-10-CM

## 2023-08-26 DIAGNOSIS — M79675 Pain in left toe(s): Secondary | ICD-10-CM | POA: Diagnosis not present

## 2023-08-26 NOTE — Progress Notes (Signed)
 Subjective: Lauren Walton presents today referred by Odell Chard, Edra GRADE, MD for diabetic foot evaluation.  Patient relates Many year history of diabetes.  Patient denies any history of foot wounds.  Patient denies any history of numbness, tingling, burning, pins/needles sensations.  Past Medical History:  Diagnosis Date   Diabetes mellitus without complication (HCC)    Hypertension    Thyroid disease     Patient Active Problem List   Diagnosis Date Noted   Pain and swelling of lower leg 06/02/2020   Varicose veins of both lower extremities with inflammation 05/31/2020   Diabetes (HCC) 05/31/2020   Osteoarthritis of knee 08/11/2017   Constipation 09/20/2016   Hyperlipemia, mixed 04/16/2016   Primary hypertension 04/16/2016   Neck pain 04/16/2016   Need for prophylactic vaccination against Streptococcus pneumoniae (pneumococcus) 04/16/2016   Cervical radiculitis 04/02/2016   Shoulder pain 04/02/2016   Acquired hypothyroidism 03/07/2016   Breast cancer screening 03/07/2016   Morbid (severe) obesity due to excess calories (HCC) 03/07/2016   Thrombocytopenia (HCC) 03/07/2016   Type 2 diabetes mellitus without complication (HCC) 03/07/2016    Past Surgical History:  Procedure Laterality Date   ABDOMINAL HYSTERECTOMY     CHOLECYSTECTOMY      Current Outpatient Medications on File Prior to Visit  Medication Sig Dispense Refill   acetaminophen  (TYLENOL ) 650 MG CR tablet Take 650 mg by mouth every 8 (eight) hours as needed for pain.     aspirin EC 81 MG tablet Take 81 mg by mouth daily.     cetirizine (ZYRTEC) 10 MG tablet Take 1 tablet by mouth daily.     diclofenac  Sodium (VOLTAREN ) 1 % GEL Apply 4 g topically 4 (four) times daily. 100 g 3   doxycycline  (VIBRAMYCIN ) 50 MG capsule Take 2 capsules (100 mg total) by mouth 2 (two) times daily. 28 capsule 0   famotidine (PEPCID) 10 MG tablet Take 10 mg by mouth 2 (two) times daily.     hydrochlorothiazide (HYDRODIURIL) 25  MG tablet Take 25 mg by mouth daily.     Levothyroxine Sodium 150 MCG CAPS Take 150 mcg by mouth daily before breakfast.      losartan (COZAAR) 25 MG tablet Take 25 mg by mouth daily.     meclizine  (ANTIVERT ) 12.5 MG tablet Take 1 tablet (12.5 mg total) by mouth 3 (three) times daily as needed for dizziness. 30 tablet 0   metFORMIN (GLUCOPHAGE) 500 MG tablet Take by mouth 2 (two) times daily with a meal.     omeprazole (PRILOSEC) 40 MG capsule Take by mouth.     simvastatin (ZOCOR) 20 MG tablet Take 20 mg by mouth daily.     sucralfate (CARAFATE) 1 g tablet Take 1 g by mouth 4 (four) times daily -  with meals and at bedtime.     traMADol  (ULTRAM ) 50 MG tablet Take 1 tablet (50 mg total) by mouth every 6 (six) hours as needed. 20 tablet 0   No current facility-administered medications on file prior to visit.     Allergies  Allergen Reactions   Ace Inhibitors Swelling   Penicillins Other (See Comments)   Percocet [Oxycodone-Acetaminophen ]     dizziness   Aspirin     Gi complications    Social History   Occupational History   Not on file  Tobacco Use   Smoking status: Never   Smokeless tobacco: Never  Substance and Sexual Activity   Alcohol use: No   Drug use: No   Sexual  activity: Not on file    Family History  Problem Relation Age of Onset   Diabetes Mother    Heart disease Mother    Arthritis Mother    Colon cancer Father    Breast cancer Cousin     Immunization History  Administered Date(s) Administered   Moderna Sars-Covid-2 Vaccination 04/03/2019, 05/01/2019, 12/18/2019    Review of systems: Positive Findings in bold print.  Constitutional:  chills, fatigue, fever, sweats, weight change Communication: Nurse, learning disability, sign Presenter, broadcasting, hand writing, iPad/Android device Head: headaches, head injury Eyes: changes in vision, eye pain, glaucoma, cataracts, macular degeneration, diplopia, glare,  light sensitivity, eyeglasses or contacts, blindness Ears nose  mouth throat: hearing impaired, hearing aids,  ringing in ears, deaf, sign language,  vertigo, nosebleeds,  rhinitis,  cold sores, snoring, swollen glands Cardiovascular: HTN, edema, arrhythmia, pacemaker in place, defibrillator in place, chest pain/tightness, chronic anticoagulation, blood clot, heart failure, MI Peripheral Vascular: leg cramps, varicose veins, blood clots, lymphedema, varicosities Respiratory:  asthma, difficulty breathing, denies congestion, SOB, wheezing, cough, emphysema Gastrointestinal: change in appetite or weight, abdominal pain, constipation, diarrhea, nausea, vomiting, vomiting blood, change in bowel habits, abdominal pain, jaundice, rectal bleeding, hemorrhoids, GERD Genitourinary:  nocturia,  pain on urination, polyuria,  blood in urine, Foley catheter, urinary urgency, ESRD on hemodialysis Musculoskeletal: amputation, cramping, stiff joints, painful joints, decreased joint motion, fractures, OA, gout, hemiplegia, paraplegia, uses cane, wheelchair bound, uses walker, uses rollator Skin: +changes in toenails, color change, dryness, itching, mole changes,  rash, wound(s) Neurological: headaches, numbness in feet, paresthesias in feet, burning in feet, fainting,  seizures, change in speech, migraines, memory problems/poor historian, cerebral palsy, weakness, paralysis, CVA, TIA Endocrine: diabetes, hypothyroidism, hyperthyroidism,  goiter, dry mouth, flushing, heat intolerance, cold intolerance,  excessive thirst, denies polyuria,  nocturia Hematological:  easy bleeding, excessive bleeding, easy bruising, enlarged lymph nodes, on long term blood thinner, history of past transusions Allergy/immunological:  hives, eczema, frequent infections, multiple drug allergies, seasonal allergies, transplant recipient, multiple food allergies Psychiatric:  anxiety, depression, mood disorder, suicidal ideations, hallucinations, insomnia  Objective: There were no vitals filed for this  visit. Vascular Examination: Capillary refill time less than 3 seconds x 10 digits.  Dorsalis pedis pulses palpable 2 out of 4.  Posterior tibial pulses palpable 2 out of 4.  Digital hair not present x 10 digits.  Skin temperature gradient WNL b/l.  Dermatological Examination: Skin with normal turgor, texture and tone b/l  Toenails 1-5 b/l discolored, thick, dystrophic with subungual debris and pain with palpation to nailbeds due to thickness of nails.  Musculoskeletal: Muscle strength 5/5 to all LE muscle groups.  Neurological: Sensation intact with 10 gram monofilament.  Vibratory sensation intact.  Assessment: NIDDM Encounter for diabetic foot examination  Plan: Discussed diabetic foot care principles. Literature dispensed on today. Patient to continue soft, supportive shoe gear daily. Patient to report any pedal injuries to medical professional immediately. Follow up one year. Patient/POA to call should there be a concern in the interim.   Onychomycosis with pain  -Nails palliatively debrided as below. -Educated on self-care  Procedure: Nail Debridement Rationale: pain  Type of Debridement: manual, sharp debridement. Instrumentation: Nail nipper, rotary burr. Number of Nails: 10  Procedures and Treatment: Consent by patient was obtained for treatment procedures. The patient understood the discussion of treatment and procedures well. All questions were answered thoroughly reviewed. Debridement of mycotic and hypertrophic toenails, 1 through 5 bilateral and clearing of subungual debris. No ulceration, no infection noted.  Return Visit-Office  Procedure: Patient instructed to return to the office for a follow up visit 3 months for continued evaluation and treatment.  Franky Blanch, DPM    Return in about 3 months (around 11/26/2023) for RFC .

## 2023-08-28 ENCOUNTER — Encounter: Admitting: Dietician

## 2023-08-28 ENCOUNTER — Encounter: Payer: Self-pay | Admitting: Dietician

## 2023-08-28 DIAGNOSIS — E1159 Type 2 diabetes mellitus with other circulatory complications: Secondary | ICD-10-CM | POA: Diagnosis not present

## 2023-08-28 DIAGNOSIS — E119 Type 2 diabetes mellitus without complications: Secondary | ICD-10-CM

## 2023-08-28 NOTE — Progress Notes (Signed)
 Patient was seen on 08/28/2023 for the third of a series of three diabetes self-management courses at the [facility].   State the amount of activity recommended for healthy living Describe activities suitable for individual needs Identify ways to regularly incorporate activity into daily life Identify barriers to activity and ways to over come these barriers Identify diabetes medications being personally used and their primary action for lowering glucose and possible side effects Describe role of stress on blood glucose and develop strategies to address psychosocial issues Identify diabetes complications and ways to prevent them Explain how to manage diabetes during illness Evaluate success in meeting personal goal Establish 2-3 goals that they will plan to diligently work on  Goals:  I will count my carb choices at most meals and snacks I will be active 30 minutes or more 3 times a week I will take my diabetes medications as scheduled    Plan:  Follow up the RD in 3 months Attend Support Group as desired

## 2023-09-06 IMAGING — MG MM DIGITAL SCREENING BILAT W/ TOMO AND CAD
8 of 15 series · 8 of 40 positions shown · non-contrast
Comparison: Previous exam(s).

ACR Breast Density Category a: The breast tissue is almost entirely
fatty.

CLINICAL DATA: Screening.

EXAM:
DIGITAL SCREENING BILATERAL MAMMOGRAM WITH TOMOSYNTHESIS AND CAD
TECHNIQUE: Bilateral screening digital craniocaudal and mediolateral oblique
mammograms were obtained. Bilateral screening digital breast
tomosynthesis was performed. The images were evaluated with
computer-aided detection.

[L MLO synth-2D (1 of 3)]
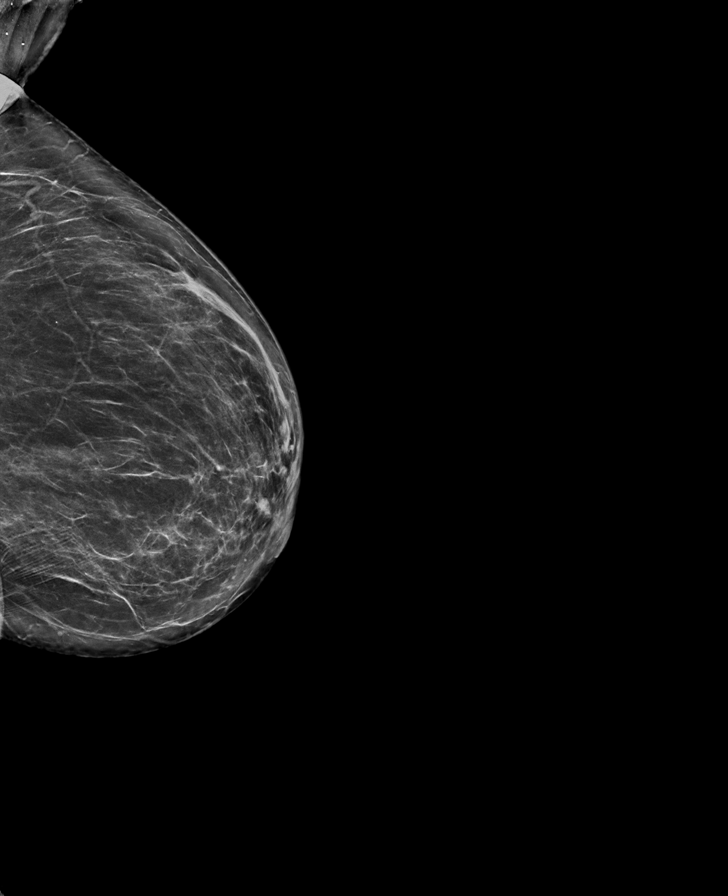

[L CC synth-2D]
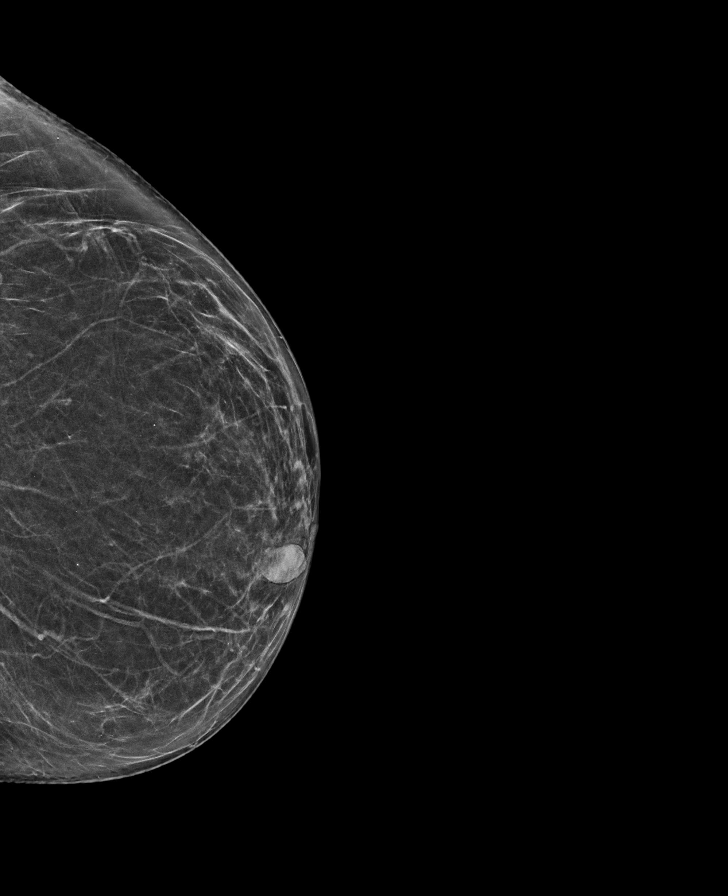

[R MLO synth-2D (1 of 2)]
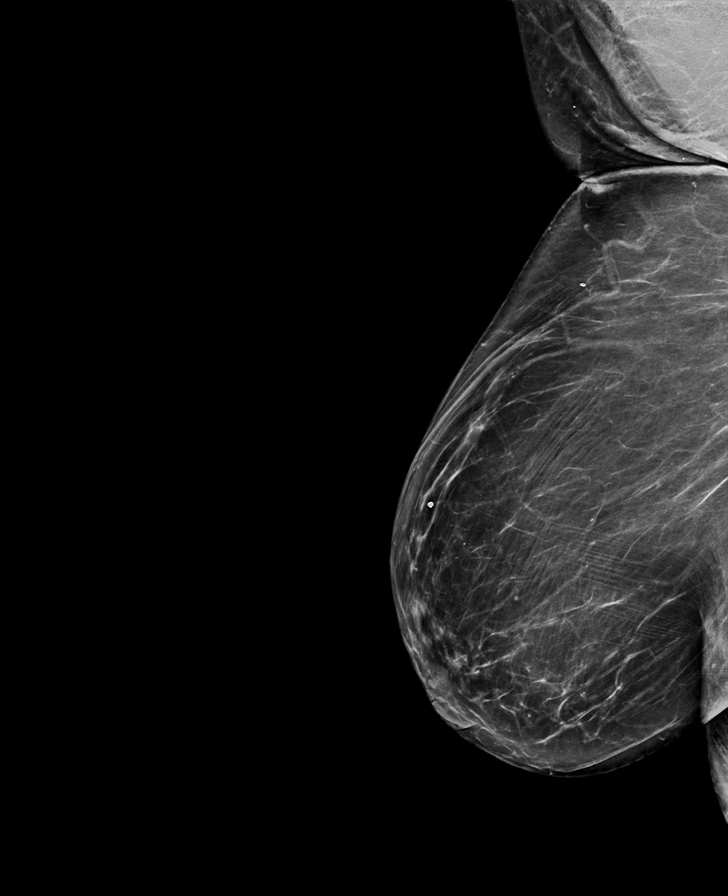

[R MLO synth-2D (2 of 2)]
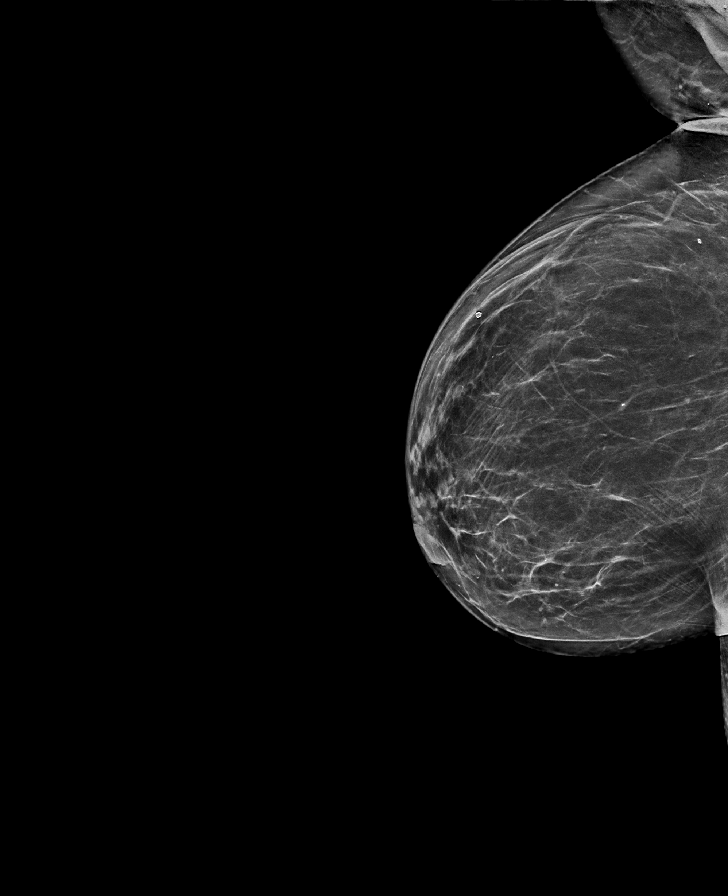

[R CC synth-2D]
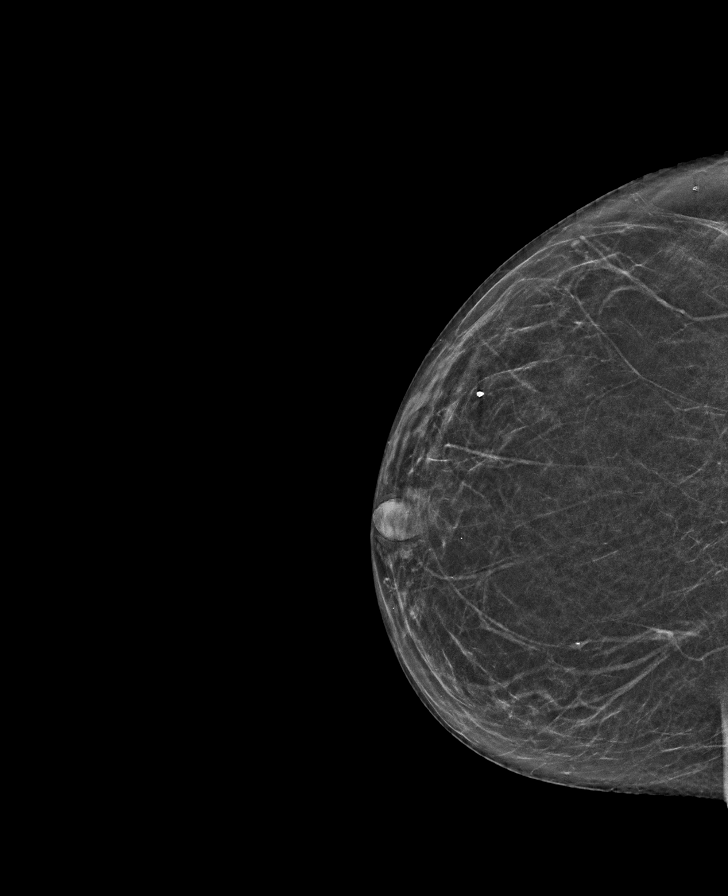

[L MLO synth-2D (2 of 3)]
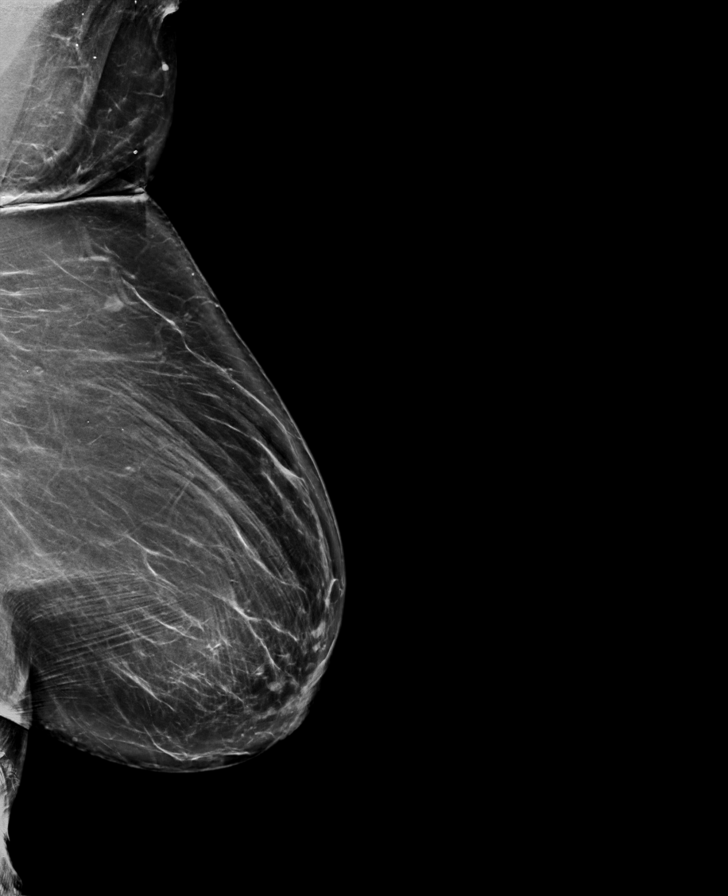

[L MLO synth-2D (3 of 3)]
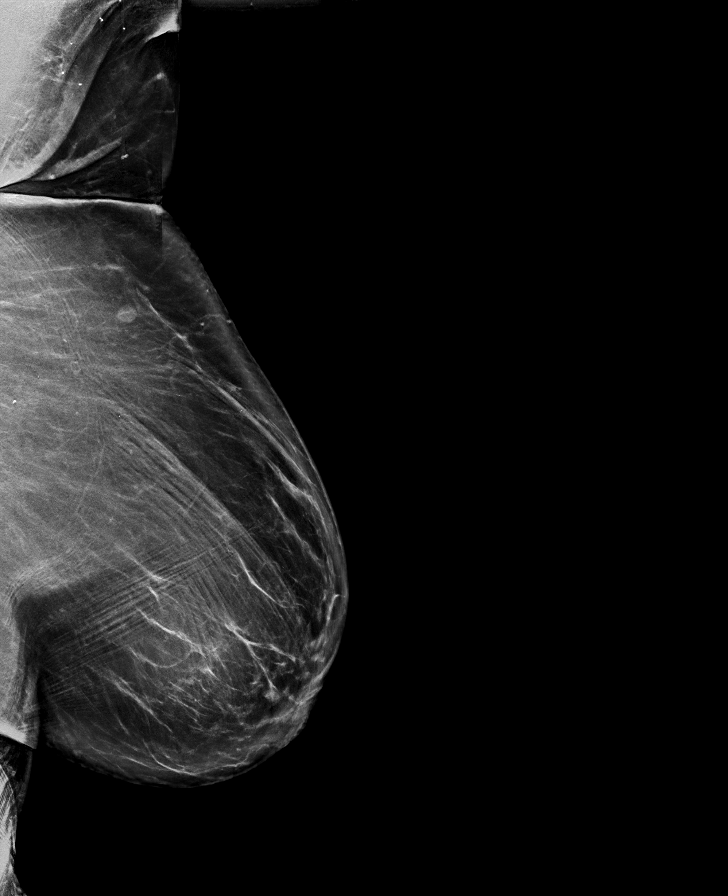

[R MLO tomo · tomo slice 56/82.0]
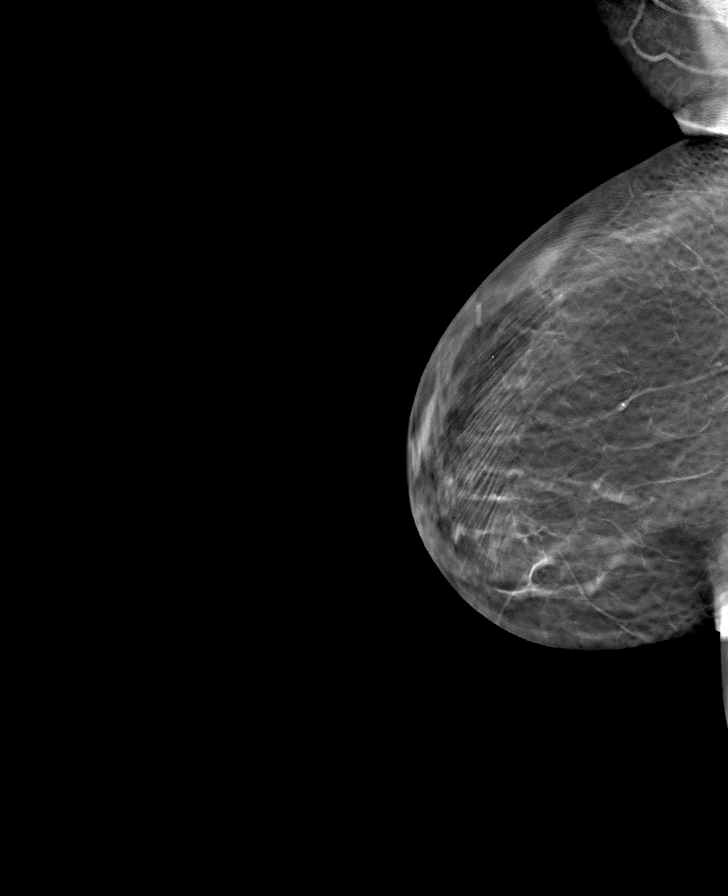

[8 of 40 positions shown; findings below may reference images not displayed]

FINDINGS: There are no findings suspicious for malignancy.
IMPRESSION: No mammographic evidence of malignancy. A result letter of this
screening mammogram will be mailed directly to the patient.

RECOMMENDATION:
Screening mammogram in one year. (Code:0E-3-N98)

BI-RADS CATEGORY  1: Negative.

## 2023-10-14 ENCOUNTER — Ambulatory Visit: Admitting: Dietician

## 2023-11-17 DIAGNOSIS — G959 Disease of spinal cord, unspecified: Secondary | ICD-10-CM | POA: Diagnosis not present

## 2023-11-17 DIAGNOSIS — M5412 Radiculopathy, cervical region: Secondary | ICD-10-CM | POA: Diagnosis not present

## 2023-11-18 ENCOUNTER — Ambulatory Visit (INDEPENDENT_AMBULATORY_CARE_PROVIDER_SITE_OTHER): Admitting: Family Medicine

## 2023-11-18 ENCOUNTER — Encounter: Payer: Self-pay | Admitting: Family Medicine

## 2023-11-18 VITALS — BP 119/69 | HR 76 | Temp 97.6°F | Ht 67.0 in | Wt 270.9 lb

## 2023-11-18 DIAGNOSIS — J00 Acute nasopharyngitis [common cold]: Secondary | ICD-10-CM | POA: Diagnosis not present

## 2023-11-18 DIAGNOSIS — J301 Allergic rhinitis due to pollen: Secondary | ICD-10-CM | POA: Diagnosis not present

## 2023-11-18 DIAGNOSIS — E1122 Type 2 diabetes mellitus with diabetic chronic kidney disease: Secondary | ICD-10-CM | POA: Diagnosis not present

## 2023-11-18 DIAGNOSIS — E039 Hypothyroidism, unspecified: Secondary | ICD-10-CM | POA: Diagnosis not present

## 2023-11-18 DIAGNOSIS — Z1159 Encounter for screening for other viral diseases: Secondary | ICD-10-CM | POA: Diagnosis not present

## 2023-11-18 DIAGNOSIS — F5104 Psychophysiologic insomnia: Secondary | ICD-10-CM

## 2023-11-18 DIAGNOSIS — M810 Age-related osteoporosis without current pathological fracture: Secondary | ICD-10-CM | POA: Diagnosis not present

## 2023-11-18 DIAGNOSIS — G8929 Other chronic pain: Secondary | ICD-10-CM

## 2023-11-18 DIAGNOSIS — H6123 Impacted cerumen, bilateral: Secondary | ICD-10-CM

## 2023-11-18 DIAGNOSIS — I10 Essential (primary) hypertension: Secondary | ICD-10-CM | POA: Diagnosis not present

## 2023-11-18 DIAGNOSIS — R002 Palpitations: Secondary | ICD-10-CM | POA: Diagnosis not present

## 2023-11-18 DIAGNOSIS — M5412 Radiculopathy, cervical region: Secondary | ICD-10-CM

## 2023-11-18 DIAGNOSIS — Z7689 Persons encountering health services in other specified circumstances: Secondary | ICD-10-CM

## 2023-11-18 DIAGNOSIS — Z13820 Encounter for screening for osteoporosis: Secondary | ICD-10-CM

## 2023-11-18 NOTE — Progress Notes (Signed)
 "  New Patient Office Visit  Introduced to nurse practitioner role and practice setting.  All questions answered.  Discussed provider/patient relationship and expectations.  Subjective    Patient ID: Lauren Walton, female    DOB: Jul 04, 1955  Age: 68 y.o. MRN: [id number]  CC:  Chief Complaint  Patient presents with   Establish Care    New patient here to establish care   HPI  Discussed the use of AI scribe software for clinical note transcription with the patient, who gave verbal consent to proceed.  History of Present Illness Lauren Walton is a 68 year old female presents for new patient visit. PMH of with diabetes, heart palpitations, hypertension, chronic neck pain (cervical spine degeneration), osteoarthritis, and has had some congestion since 11/14/23.  She has been experiencing cold symptoms since Friday, including nasal congestion and rhinorrhea. There is no associated fever or significant pain. She has been managing these symptoms with hot tea, honey, and over-the-counter medications.  She has a history of intermittent heart palpitations, often occurring with gastroesophageal reflux. There is no associated chest pain or dyspnea. Previous cardiac evaluations, including an EKG and heart monitor, revealed a first-degree AV block and occasional skipped beats. She is currently taking metoprolol for palpitations.  Her medical history includes diabetes, managed with metformin 1000 mg per meal, with a last A1c of 6.2 in April indicating good control. Hypertension is managed with hydrochlorothiazide and losartan. She also has acquired hypothyroidism post parathyroidectomy, treated with Synthroid, and osteoporosis, for which she takes vitamin D3. Gastric reflux is managed with omeprazole.  She experiences difficulty sleeping, which she attributes to factors other than caffeine, as she has stopped consuming it. She has tried melatonin but is unsure about its compatibility with her current  medications.  Her family history is significant for cancer, with three brothers having passed away from colon, lung, and stomach cancer. She has a son with lupus and a daughter with a learning disability.  Outpatient Encounter Medications as of 11/18/2023  Medication Sig   acetaminophen  (TYLENOL ) 650 MG CR tablet Take 650 mg by mouth every 8 (eight) hours as needed for pain.   aspirin EC 81 MG tablet Take 81 mg by mouth daily.   cetirizine (ZYRTEC) 10 MG tablet Take 1 tablet by mouth daily.   cholecalciferol (VITAMIN D3) 25 MCG (1000 UNIT) tablet Take 1,000 Units by mouth daily.   fluticasone (FLONASE) 50 MCG/ACT nasal spray Place into both nostrils daily.   hydrochlorothiazide (HYDRODIURIL) 25 MG tablet Take 25 mg by mouth daily.   Levothyroxine Sodium 150 MCG CAPS Take 150 mcg by mouth daily before breakfast.    losartan (COZAAR) 25 MG tablet Take 25 mg by mouth daily.   meclizine  (ANTIVERT ) 12.5 MG tablet Take 1 tablet (12.5 mg total) by mouth 3 (three) times daily as needed for dizziness.   metFORMIN (GLUCOPHAGE) 500 MG tablet Take by mouth 2 (two) times daily with a meal.   metoprolol succinate (TOPROL-XL) 25 MG 24 hr tablet Take 12.5 mg by mouth daily.   omeprazole (PRILOSEC) 40 MG capsule Take by mouth.   simvastatin (ZOCOR) 20 MG tablet Take 20 mg by mouth daily.   [DISCONTINUED] terbinafine (LAMISIL) 250 MG tablet 1 tablet Orally Once a day   methylPREDNISolone (MEDROL DOSEPAK) 4 MG TBPK tablet SMARTSIG:- Tablet(s) By Mouth -   tiZANidine (ZANAFLEX) 4 MG tablet every 6 (six) hours as needed for muscle spasms.   [DISCONTINUED] diclofenac  Sodium (VOLTAREN ) 1 % GEL Apply 4 g  topically 4 (four) times daily.   [DISCONTINUED] doxycycline  (VIBRAMYCIN ) 50 MG capsule Take 2 capsules (100 mg total) by mouth 2 (two) times daily.   [DISCONTINUED] famotidine (PEPCID) 10 MG tablet Take 10 mg by mouth 2 (two) times daily.   [DISCONTINUED] sucralfate (CARAFATE) 1 g tablet Take 1 g by mouth 4 (four)  times daily -  with meals and at bedtime.   [DISCONTINUED] traMADol  (ULTRAM ) 50 MG tablet Take 1 tablet (50 mg total) by mouth every 6 (six) hours as needed.   No facility-administered encounter medications on file as of 11/18/2023.    Past Medical History:  Diagnosis Date   Allergy    Arthritis    Diabetes mellitus without complication (HCC)    GERD (gastroesophageal reflux disease)    Hypertension    Osteoporosis    Thyroid disease     Past Surgical History:  Procedure Laterality Date   ABDOMINAL HYSTERECTOMY     CHOLECYSTECTOMY     PARATHYROIDECTOMY  1998    Family History  Problem Relation Age of Onset   Diabetes Mother    Heart disease Mother    Arthritis Mother    Colon cancer Father    Arthritis Sister    Colon cancer Brother    Lung cancer Brother    Stomach cancer Brother    Healthy Brother    Breast cancer Cousin    Lupus Son    Healthy Son    Learning disabilities Daughter     Social History   Socioeconomic History   Marital status: Married    Spouse name: Not on file   Number of children: Not on file   Years of education: Not on file   Highest education level: Not on file  Occupational History   Not on file  Tobacco Use   Smoking status: Never   Smokeless tobacco: Never  Vaping Use   Vaping status: Never Used  Substance and Sexual Activity   Alcohol use: No   Drug use: No   Sexual activity: Not on file  Other Topics Concern   Not on file  Social History Narrative   Not on file   Social Drivers of Health   Financial Resource Strain: Low Risk  (03/27/2022)   Received [facility]   Overall Financial Resource Strain (CARDIA)    Difficulty of Paying Living Expenses: Not very hard  Food Insecurity: No Food Insecurity (04/15/2023)   Received from [facility]   Hunger Vital Sign    Within the past 12 months, you worried that your food would run out before you got the money to buy more.: Never true    Within the past 12 months,  the food you bought just didn't last and you didn't have money to get more.: Never true  Transportation Needs: No Transportation Needs (04/15/2023)   Received from [facility]   PRAPARE - Transportation    Lack of Transportation (Medical): No    Lack of Transportation (Non-Medical): No  Physical Activity: Sufficiently Active (02/27/2021)   Received from [facility]   Exercise Vital Sign    On average, how many days per week do you engage in moderate to strenuous exercise (like a brisk walk)?: 7 days    On average, how many minutes do you engage in exercise at this level?: 30 min  Stress: Not on file  Social Connections: Not on file  Intimate Partner Violence: Not on file    ROS  Objective    BP 119/69 (BP Location: Left Arm, Patient Position: Sitting, Cuff Size: Large)   Pulse 76   Temp 97.6 F (36.4 C) (Oral)   Ht 5' 7 (1.702 m)   Wt 270 lb 14.4 oz (122.9 kg)   SpO2 99%   BMI 42.43 kg/m   Physical Exam Constitutional:      General: She is not in acute distress.    Appearance: Normal appearance. She is morbidly obese. She is not ill-appearing, toxic-appearing or diaphoretic.  HENT:     Head: Normocephalic.     Ears:     Comments: Excessive cerumen    Nose: Congestion and rhinorrhea present.     Right Turbinates: Not enlarged or swollen.     Left Turbinates: Not enlarged or swollen.     Right Sinus: No maxillary sinus tenderness or frontal sinus tenderness.     Left Sinus: No frontal sinus tenderness.     Mouth/Throat:     Mouth: Mucous membranes are moist.     Pharynx: Oropharynx is clear.  Eyes:     Extraocular Movements: Extraocular movements intact.     Pupils: Pupils are equal, round, and reactive to light.  Neck:     Vascular: No carotid bruit.  Cardiovascular:     Rate and Rhythm: Normal rate and regular rhythm.     Pulses: Normal pulses.     Heart sounds: Normal heart sounds. No murmur heard.    No friction rub. No gallop.  Pulmonary:      Effort: No respiratory distress.     Breath sounds: No stridor. No wheezing, rhonchi or rales.  Chest:     Chest wall: No tenderness.  Musculoskeletal:     Cervical back: Deformity and rigidity present.     Right lower leg: No edema.     Left lower leg: No edema.     Comments: Chronic cervical pain, decreased Rom due to cervical degenerative changes.  Lymphadenopathy:     Cervical: No cervical adenopathy.  Skin:    General: Skin is warm and dry.     Capillary Refill: Capillary refill takes less than 2 seconds.  Neurological:     General: No focal deficit present.     Mental Status: She is alert and oriented to person, place, and time. Mental status is at baseline.     Cranial Nerves: No cranial nerve deficit.     Motor: No weakness.     Gait: Gait normal.  Psychiatric:        Mood and Affect: Mood normal.        Behavior: Behavior normal.        Thought Content: Thought content normal.        Judgment: Judgment normal.         Assessment & Plan:  Assessment and Plan Assessment & Plan New Patient/ Doctor establishment - general maintenance discussed - states up to date all prescriptions - hold flu vaccine today, URI symptoms today - appears viral/ allergic - Order DEXA scan for osteoporosis screening - Check A1c, urine for protein, lipid panel, and thyroid function - Ensure flu vaccine is administered after recovery from current illness  Type 2 diabetes mellitus - well-controlled with A1c of 6.2 as of April. - continue metformin 1000 mg BID - on statin and arb - Check A1c, urine for protein, and lipid panel  Hypertension - chronic, controlled - continue hydrochlorothiazide 25mg  daily - continue losartan 25mg  daily - continue metoprolol 12.5mg  daily  Hyperlipidemia associated to DMII - continue simvastatin 20mg  daily - check lipid panel today  Intermittent palpitations - denies chest pain, DOB, SOB, syncope - continue metoprolol - previously seen by cards -  last visit December 2023 at [facility] - has had zio patch - January 2023 - which had two runs of SVT, otherwise unremarkable was started on metoprolol after. Heart rate controlled today, PR 76. - declines reordering zio patch - no abnormal heart sounds or rate today - recent EKG June 2025 - NSR with 1st degree av block - [facility] discussed new referral to closer cardiologist - declined today  Hypothyroidism - acquired post parathyroidectomy - continue levothyroxine 150mg  daily - Check thyroid function  Osteoporosis - no recent DEXA scan reported. Screening needed to assess bone density and potential need for medication. - Order DEXA scan for osteoporosis screening - continue vitamin d3 and calcium   Gastroesophageal reflux disease - Symptoms seem to trigger palpitations. Managed with omeprazole. - Advise dietary modifications to reduce reflux symptoms - Encourage sitting up after meals and drinking water  Allergic rhinitis - managed with Zyrtec and Flonase.  Osteoarthritis - never had dexa scan per patient - order screening - continue vitamin D  supplement and calcium , check level today  [place] Obesity - Continue to make conscious decisions for well balanced diet smaller portions with increase protein, fruits, veggies, water as drink of choice, decrease starches, processed foods, and saturated fats. weekly purposeful movement - 150 minutes per week.   Cervical Pain - appears intermittently chronic d/t cervical spine degenerative changes - recently seen by ortho - given steroid pack and muscle relaxant - denies any weakness or numbness and tingling at this   Insomnia - possibly related to caffeine intake and lack of sleep schedule.  - Discussed sleep hygiene and potential use of melatonin. - Advise on sleep hygiene practices - Recommend melatonin 3 mg if needed - education provided  Acute upper respiratory infection - Symptoms  include congestion and runny nose. No fever or significant pain. - Advise symptomatic management with hot tea, honey, and hydration - Use Tylenol  or ibuprofen  as needed for discomfort - Flonase and mucinex  - if symptoms persist into next week may need ABX  Excessive Cerumen Cerumen impaction noted in ears. - Recommend Debrox for earwax removal  Primary hypertension -     Comprehensive metabolic panel with GFR  Type 2 diabetes mellitus with chronic kidney disease, without long-term current use of insulin, unspecified CKD stage (HCC) -     Comprehensive metabolic panel with GFR -     Hemoglobin A1c -     Microalbumin / creatinine urine ratio -     Lipid panel  Intermittent palpitations  Acquired hypothyroidism -     TSH  Morbid (severe) obesity due to excess calories (HCC) -     CBC -     Comprehensive metabolic panel with GFR -     Lipid panel  Seasonal allergic rhinitis due to pollen  Acute nasopharyngitis  Osteoporosis, unspecified osteoporosis type, unspecified pathological fracture presence -     VITAMIN D  25 Hydroxy (Vit-D Deficiency, Fractures)  Psychophysiological insomnia  Chronic radicular cervical pain  Excessive cerumen in both ear canals  Screening for viral disease -     HIV Antibody (routine testing w rflx) -     Hepatitis C antibody  Screening for osteoporosis -     DG Bone Density; Future  Encounter to establish care with new  doctor   Return in about 3 months (around 02/18/2024) for Chronic Disease mgmt & a1c check.   I, Curtis DELENA Boom, FNP, have reviewed all documentation for this visit. The documentation on 11/19/23 for the exam, diagnosis, procedures, and orders are all accurate and complete.   Curtis DELENA Boom, FNP  "

## 2023-11-19 ENCOUNTER — Encounter: Payer: Self-pay | Admitting: Family Medicine

## 2023-11-19 ENCOUNTER — Ambulatory Visit: Payer: Self-pay | Admitting: Family Medicine

## 2023-11-19 DIAGNOSIS — E782 Mixed hyperlipidemia: Secondary | ICD-10-CM

## 2023-11-19 DIAGNOSIS — E039 Hypothyroidism, unspecified: Secondary | ICD-10-CM

## 2023-11-19 LAB — TSH: TSH: 5.44 u[IU]/mL — ABNORMAL HIGH (ref 0.450–4.500)

## 2023-11-19 LAB — COMPREHENSIVE METABOLIC PANEL WITH GFR
ALT: 12 IU/L (ref 0–32)
AST: 12 IU/L (ref 0–40)
Albumin: 4.4 g/dL (ref 3.9–4.9)
Alkaline Phosphatase: 79 IU/L (ref 49–135)
BUN/Creatinine Ratio: 29 — ABNORMAL HIGH (ref 12–28)
BUN: 28 mg/dL — ABNORMAL HIGH (ref 8–27)
Bilirubin Total: 0.2 mg/dL (ref 0.0–1.2)
CO2: 21 mmol/L (ref 20–29)
Calcium: 10.4 mg/dL — ABNORMAL HIGH (ref 8.7–10.3)
Chloride: 97 mmol/L (ref 96–106)
Creatinine, Ser: 0.96 mg/dL (ref 0.57–1.00)
Globulin, Total: 2.9 g/dL (ref 1.5–4.5)
Glucose: 99 mg/dL (ref 70–99)
Potassium: 4.6 mmol/L (ref 3.5–5.2)
Sodium: 144 mmol/L (ref 134–144)
Total Protein: 7.3 g/dL (ref 6.0–8.5)
eGFR: 64 mL/min/1.73 (ref >59)

## 2023-11-19 LAB — CBC
Hematocrit: 35.9 % (ref 34.0–46.6)
Hemoglobin: 10.9 g/dL — ABNORMAL LOW (ref 11.1–15.9)
MCH: 26 pg — ABNORMAL LOW (ref 26.6–33.0)
MCHC: 30.4 g/dL — ABNORMAL LOW (ref 31.5–35.7)
MCV: 86 fL (ref 79–97)
Platelets: 190 x10E3/uL (ref 150–450)
RBC: 4.2 x10E6/uL (ref 3.77–5.28)
RDW: 15.4 % (ref 11.7–15.4)
WBC: 5.1 x10E3/uL (ref 3.4–10.8)

## 2023-11-19 LAB — HEMOGLOBIN A1C
Est. average glucose Bld gHb Est-mCnc: 146 mg/dL
Hgb A1c MFr Bld: 6.7 % — ABNORMAL HIGH (ref 4.8–5.6)

## 2023-11-19 LAB — HEPATITIS C ANTIBODY: Hep C Virus Ab: NONREACTIVE

## 2023-11-19 LAB — LIPID PANEL
Chol/HDL Ratio: 2.7 ratio (ref 0.0–4.4)
Cholesterol, Total: 170 mg/dL (ref 100–199)
HDL: 62 mg/dL (ref >39)
LDL Chol Calc (NIH): 90 mg/dL (ref 0–99)
Triglycerides: 100 mg/dL (ref 0–149)
VLDL Cholesterol Cal: 18 mg/dL (ref 5–40)

## 2023-11-19 LAB — MICROALBUMIN / CREATININE URINE RATIO
Creatinine, Urine: 57.1 mg/dL
Microalb/Creat Ratio: <5 mg/g{creat} (ref 0–29)
Microalbumin, Urine: <3 ug/mL

## 2023-11-19 LAB — VITAMIN D 25 HYDROXY (VIT D DEFICIENCY, FRACTURES): Vit D, 25-Hydroxy: 24.5 ng/mL — ABNORMAL LOW (ref 30.0–100.0)

## 2023-11-19 LAB — HIV ANTIBODY (ROUTINE TESTING W REFLEX): HIV Screen 4th Generation wRfx: NONREACTIVE

## 2023-11-20 MED ORDER — ROSUVASTATIN CALCIUM 20 MG PO TABS: 20.0000 mg | ORAL_TABLET | Freq: Every day | ORAL | 1 refills | Status: DC

## 2023-11-20 NOTE — Telephone Encounter (Signed)
 Copied from CRM [id number]. Topic: Clinical - Medication Question >> Nov 19, 2023  2:41 PM Thersia C wrote: Reason for CRM: Patient called in regarding cholesterol medication, wanted to know if that will be sent in to the pharmacy patient also stated that she needs to take  3000 I U of Vitamin D wanted to know if she needs to buy that OTC or if that is getting sent in to the pharmacy to    [phone]

## 2023-11-24 ENCOUNTER — Ambulatory Visit (INDEPENDENT_AMBULATORY_CARE_PROVIDER_SITE_OTHER): Admitting: Family Medicine

## 2023-11-24 ENCOUNTER — Ambulatory Visit: Payer: Self-pay

## 2023-11-24 ENCOUNTER — Encounter: Payer: Self-pay | Admitting: Family Medicine

## 2023-11-24 VITALS — BP 129/51 | HR 70 | Ht 67.0 in | Wt 267.3 lb

## 2023-11-24 DIAGNOSIS — J40 Bronchitis, not specified as acute or chronic: Secondary | ICD-10-CM | POA: Diagnosis not present

## 2023-11-24 MED ORDER — AZITHROMYCIN 250 MG PO TABS: ORAL_TABLET | ORAL | 0 refills | Status: DC

## 2023-11-24 MED ORDER — BENZONATATE 200 MG PO CAPS: 200.0000 mg | ORAL_CAPSULE | Freq: Two times a day (BID) | ORAL | 0 refills | Status: DC | PRN

## 2023-11-24 NOTE — Telephone Encounter (Signed)
 FYI Only or Action Required?: FYI only for provider.  Patient was last seen in primary care on 11/18/2023 by Wellington Curtis LABOR, FNP.  Called Nurse Triage reporting Cough.  Symptoms began several weeks ago.  Interventions attempted: Other: Seen in office.  Symptoms are: gradually worsening.  Triage Disposition: See Physician Within 24 Hours  Patient/caregiver understands and will follow disposition?: Yes - Appt for this afternoon at [facility]                Copied from CRM #[id number]. Topic: Clinical - Red Word Triage >> Nov 24, 2023  8:10 AM Charlet HERO wrote: Red Word that prompted transfer to Nurse Triage: Patient is calling about cough that is not going away but getting worse she had appt last Tuesday with Curtis Wellington and was told to call in for appt if got worse BFP Reason for Disposition  [1] Continuous (nonstop) coughing interferes with work or school AND [2] no improvement using cough treatment per Care Advice  Answer Assessment - Initial Assessment Questions 1. ONSET: When did the cough begin?      2 weeks 2. SEVERITY: How bad is the cough today?      Every couple minutes 3. SPUTUM: Describe the color of your sputum (e.g., none, dry cough; clear, white, yellow, green)     Clear 4. HEMOPTYSIS: Are you coughing up any blood? If Yes, ask: How much? (e.g., flecks, streaks, tablespoons, etc.)     no 5. DIFFICULTY BREATHING: Are you having difficulty breathing? If Yes, ask: How bad is it? (e.g., mild, moderate, severe)      no 6. FEVER: Do you have a fever? If Yes, ask: What is your temperature, how was it measured, and when did it start?     no 7. CARDIAC HISTORY: Do you have any history of heart disease? (e.g., heart attack, congestive heart failure)      no 8. LUNG HISTORY: Do you have any history of lung disease?  (e.g., pulmonary embolus, asthma, emphysema)     no 9. PE RISK FACTORS: Do you have a history of blood clots? (or: recent major  surgery, recent prolonged travel, bedridden)     no 10. OTHER SYMPTOMS: Do you have any other symptoms? (e.g., runny nose, wheezing, chest pain)       Runny nose - sneezing  Protocols used: Cough - Acute Non-Productive-A-AH

## 2023-11-24 NOTE — Progress Notes (Signed)
 Acute visit   Patient: Lauren Walton   DOB: 1955-05-06   68 y.o. Female  MRN: [id number] PCP: Wellington Curtis DELENA, FNP   Chief Complaint  Patient presents with   Acute Visit    Patient is present about cough that is not going away but getting worse she had appt last Tuesday with Curtis Wellington and was told to call in for appt if got worse BFP. RN trauge completed this morning    Cough    She reports taking otc medications for symptoms but they dont seem to be helping. No other associated symptoms   Subjective    Discussed the use of AI scribe software for clinical note transcription with the patient, who gave verbal consent to proceed.  History of Present Illness   Lauren Walton is a 68 year old female with diabetes, hypertension, hyperlipidemia, and morbid obesity who presents with a persistent cough.  She has had a persistent cough for two weeks, initially improving but then worsening. The cough is productive with yellow or clear sputum. She also has a runny nose with yellow discharge and no fever. Over-the-counter cough medicine has not provided significant relief. She has no history of asthma or COPD and does not smoke. Her current medications include losartan 50 mg and hydrochlorothiazide 25 mg.        Review of Systems  Objective    BP (!) 129/51 (BP Location: Right Arm, Patient Position: Sitting, Cuff Size: Large)   Pulse 70   Ht 5' 7 (1.702 m)   Wt 267 lb 4.8 oz (121.2 kg)   SpO2 100%   BMI 41.87 kg/m  Physical Exam Vitals reviewed.  Constitutional:      General: She is not in acute distress.    Appearance: Normal appearance. She is well-developed. She is not diaphoretic.  HENT:     Head: Normocephalic and atraumatic.  Eyes:     General: No scleral icterus.    Conjunctiva/sclera: Conjunctivae normal.  Neck:     Thyroid: No thyromegaly.  Cardiovascular:     Rate and Rhythm: Normal rate and regular rhythm.     Heart sounds: Normal heart sounds. No  murmur heard. Pulmonary:     Effort: Pulmonary effort is normal. No respiratory distress.     Breath sounds: Rhonchi present. No wheezing or rales.  Musculoskeletal:     Cervical back: Neck supple.     Right lower leg: No edema.     Left lower leg: No edema.  Lymphadenopathy:     Cervical: No cervical adenopathy.  Skin:    General: Skin is warm and dry.     Findings: No rash.  Neurological:     Mental Status: She is alert and oriented to person, place, and time. Mental status is at baseline.  Psychiatric:        Mood and Affect: Mood normal.        Behavior: Behavior normal.       No results found for any visits on 11/24/23.  Assessment & Plan     Problem List Items Addressed This Visit   None Visit Diagnoses       Bronchitis    -  Primary           Acute bronchitis Acute bronchitis with a two-week duration, characterized by a persistent cough producing yellow and clear sputum. Auscultation reveals coarse breath sounds indicative of bronchitis. The condition is likely viral but has not resolved spontaneously,  warranting antibiotic treatment. Differential diagnosis of a blood clot was considered but deemed unlikely due to the presence of bronchitis-specific lung sounds. - Prescribe azithromycin (Z-Pak): 2 pills on the first day, then 1 pill daily for the next 4 days. - Prescribe Tessalon Perles for cough relief, to be taken up to twice daily as needed. - Advise to stay well hydrated and take azithromycin with food. - Instruct to monitor symptoms and report if not improving after completing the antibiotic course.       Meds ordered this encounter  Medications   azithromycin (ZITHROMAX) 250 MG tablet    Sig: Take 500mg  PO daily x1d and then 250mg  daily x4 days    Dispense:  6 each    Refill:  0   benzonatate (TESSALON) 200 MG capsule    Sig: Take 1 capsule (200 mg total) by mouth 2 (two) times daily as needed for cough.    Dispense:  20 capsule    Refill:  0      Return if symptoms worsen or fail to improve.      Jon Eva, MD  [facility] [phone] (phone) [phone] (fax)  [facility]

## 2023-11-27 ENCOUNTER — Ambulatory Visit: Admitting: Podiatry

## 2023-11-30 ENCOUNTER — Other Ambulatory Visit: Payer: Self-pay

## 2023-11-30 ENCOUNTER — Encounter: Payer: Self-pay | Admitting: Emergency Medicine

## 2023-11-30 ENCOUNTER — Emergency Department

## 2023-11-30 ENCOUNTER — Emergency Department
Admission: EM | Admit: 2023-11-30 | Discharge: 2023-11-30 | Disposition: A | Discharge status: Home / Self Care | Attending: Emergency Medicine | Admitting: Emergency Medicine

## 2023-11-30 DIAGNOSIS — I10 Essential (primary) hypertension: Secondary | ICD-10-CM | POA: Diagnosis not present

## 2023-11-30 DIAGNOSIS — U071 COVID-19: Secondary | ICD-10-CM | POA: Diagnosis not present

## 2023-11-30 DIAGNOSIS — E039 Hypothyroidism, unspecified: Secondary | ICD-10-CM | POA: Diagnosis not present

## 2023-11-30 DIAGNOSIS — I700 Atherosclerosis of aorta: Secondary | ICD-10-CM | POA: Diagnosis not present

## 2023-11-30 DIAGNOSIS — E119 Type 2 diabetes mellitus without complications: Secondary | ICD-10-CM | POA: Diagnosis not present

## 2023-11-30 DIAGNOSIS — R059 Cough, unspecified: Secondary | ICD-10-CM | POA: Diagnosis not present

## 2023-11-30 LAB — RESP PANEL BY RT-PCR (RSV, FLU A&B, COVID)  RVPGX2
Influenza A by PCR: NEGATIVE
Influenza B by PCR: NEGATIVE
Resp Syncytial Virus by PCR: NEGATIVE
SARS Coronavirus 2 by RT PCR: POSITIVE — AB

## 2023-11-30 NOTE — ED Triage Notes (Signed)
 Pt presents to the ED via POV from home. Pt reports cough, runny nose, and congestion x3 weeks. Denies fevers or chills. Pt reports coughing up yellow mucus. Ambulatory to triage.

## 2023-11-30 NOTE — Discharge Instructions (Signed)
 You have been diagnosed with COVID-19.  Remember that you are highly contagious to others.  You may take Tylenol  per package instructions to help with your symptoms.  Please return for any new, worsening, or change in symptoms or other concerns.  It was a pleasure caring for you today.

## 2023-11-30 NOTE — ED Provider Notes (Signed)
 [facility] Provider Note    Event Date/Time   First MD Initiated Contact with Patient 11/30/23 0802     (approximate)   History   Cough   HPI  Lauren Walton is a 68 y.o. female with a past medical history of hypertension, hyperlipidemia, diabetes who presents today for evaluation of cough for the past 3 weeks.  She reports that overall she is getting improved, but still has symptoms prompting her to come in for evaluation.  No fevers or chills.  No chest pain or shortness of breath.  She also reports that she has nasal congestion.  She reports that she has normal energy levels and has normal appetite.  Patient reports that her daughter is sick with the same symptoms.  Patient Active Problem List   Diagnosis Date Noted   Varicose veins of both lower extremities with inflammation 05/31/2020   Diabetes (HCC) 05/31/2020   Osteoarthritis of knee 08/11/2017   Hyperlipemia, mixed 04/16/2016   Primary hypertension 04/16/2016   Cervical radiculitis 04/02/2016   Acquired hypothyroidism 03/07/2016   Morbid (severe) obesity due to excess calories (HCC) 03/07/2016   Thrombocytopenia 03/07/2016   Type 2 diabetes mellitus without complication 03/07/2016          Physical Exam   Triage Vital Signs: ED Triage Vitals [11/30/23 0721]  Encounter Vitals Group     BP 136/75     Girls Systolic BP Percentile      Girls Diastolic BP Percentile      Boys Systolic BP Percentile      Boys Diastolic BP Percentile      Pulse Rate 90     Resp 18     Temp 97.8 F (36.6 C)     Temp Source Oral     SpO2 100 %     Weight 265 lb (120.2 kg)     Height 5' 7 (1.702 m)     Head Circumference      Peak Flow      Pain Score 0     Pain Loc      Pain Education      Exclude from Growth Chart     Most recent vital signs: Vitals:   11/30/23 0721  BP: 136/75  Pulse: 90  Resp: 18  Temp: 97.8 F (36.6 C)  SpO2: 100%    Physical Exam Vitals and nursing note  reviewed.  Constitutional:      General: Awake and alert. No acute distress.    Appearance: Normal appearance.  HENT:     Head: Normocephalic and atraumatic.     Mouth: Mucous membranes are moist.  Eyes:     General: PERRL. Normal EOMs        Right eye: No discharge.        Left eye: No discharge.     Conjunctiva/sclera: Conjunctivae normal.  Cardiovascular:     Rate and Rhythm: Normal rate and regular rhythm.     Pulses: Normal pulses.  Pulmonary:     Effort: Pulmonary effort is normal. No respiratory distress.     Breath sounds: Normal breath sounds.  Abdominal:     Abdomen is soft. There is no abdominal tenderness. No rebound or guarding. No distention. Musculoskeletal:        General: No swelling. Normal range of motion.     Cervical back: Normal range of motion and neck supple.  Skin:    General: Skin is warm and dry.  Capillary Refill: Capillary refill takes less than 2 seconds.     Findings: No rash.  Neurological:     Mental Status: The patient is awake and alert.      ED Results / Procedures / Treatments   Labs (all labs ordered are listed, but only abnormal results are displayed) Labs Reviewed  RESP PANEL BY RT-PCR (RSV, FLU A&B, COVID)  RVPGX2 - Abnormal; Notable for the following components:      Result Value   SARS Coronavirus 2 by RT PCR POSITIVE (*)    All other components within normal limits     EKG     RADIOLOGY I independently reviewed and interpreted imaging and agree with radiologists findings.     PROCEDURES:  Critical Care performed:   Procedures   MEDICATIONS ORDERED IN ED: Medications - No data to display   IMPRESSION / MDM / ASSESSMENT AND PLAN / ED COURSE  I reviewed the triage vital signs and the nursing notes.   Differential diagnosis includes, but is not limited to, COVID, URI, bronchitis, pneumonia.  Patient is awake and alert, hemodynamically stable and afebrile.  She has normal oxygen saturation 100% on room  air.  She demonstrates no increased work of breathing.  She is ambulatory with a steady gait.  X-ray obtained is negative for any acute cardiopulmonary abnormalities.  Swabs obtained in triage are positive for COVID-19.  Given that patient has had symptoms for 3 weeks, she is out of the window for treatment with Paxlovid.  She does not require hospitalization at this time given her normal oxygen saturation, lack of fever, ability to care for herself, normal energy levels, and she feels comfortable going home.  She is with her daughter who feels comfortable with her being discharged home.  We did discuss strict return precautions and the importance of close outpatient follow-up.  Patient understands and agrees with plan.  Discharged in stable condition.  Patient's presentation is most consistent with acute complicated illness / injury requiring diagnostic workup.    FINAL CLINICAL IMPRESSION(S) / ED DIAGNOSES   Final diagnoses:  COVID-19     Rx / DC Orders   ED Discharge Orders     None        Note:  This document was prepared using Dragon voice recognition software and may include unintentional dictation errors.   Isobel Eisenhuth E, PA-C 11/30/23 1400    Arlander Charleston, MD 11/30/23 1407

## 2023-11-30 NOTE — ED Notes (Signed)
 See triage note  Presents with a 3 week hx of cough  States she was recently seen by her PCP   Was given meds  But thinks she is not any better  Afebrile on arrival Resp even and non labored

## 2023-12-03 ENCOUNTER — Ambulatory Visit: Payer: Self-pay

## 2023-12-03 NOTE — Telephone Encounter (Signed)
 FYI Only or Action Required?: FYI only for provider: appointment scheduled on 12/04/23.  Patient was last seen in primary care on 11/24/2023 by Lauren Jon HERO, MD.  Called Nurse Triage reporting Cough.  Symptoms began several weeks ago.  Interventions attempted: OTC medications: coricidin.  Symptoms are: unchanged.  Triage Disposition: See PCP Within 2 Weeks  Patient/caregiver understands and will follow disposition?: Yes     Copied from CRM [id number]. Topic: Clinical - Red Word Triage >> Dec 03, 2023  2:34 PM [name] wrote: Red Word that prompted transfer to Nurse Triage: Patient is reporting intense coughing due to COVID, patient is wanting to know if there is any thing she can take OTC for the coughing. Reason for Disposition  [1] Persisting symptoms of COVID-19 AND [2] symptoms SAME AND [3] medical visit for COVID-19 in past 2 weeks  Answer Assessment - Initial Assessment Questions Pt has been having symptoms for over 3 weeks. She was in the ER on Sunday and confirmed positive for covid. She states EMS was out there today due to this intense coughing and they told her it was better for her to stay home so she doesn't get anyone else sick or pick up anything else. Pt states short of breath with coughing at times. She denies any other symptoms, asked if she can take coricidin for cough. Rn advised that would be best given her hx of HTN. Due to ongoing symptoms and no improvement, f/u appt moved up and pt advised to wear mask to appt.     1. SYMPTOMS: What is your main symptom or concern? (e.g., cough, fever, shortness of breath, muscle aches)     Cough sometimes with shortness of breath.  2. ONSET: When did the symptoms start?      Worse this am 3. COUGH: Do you have a cough? If Yes, ask: How bad is the cough?       Intense  4. FEVER: Do you have a fever? If Yes, ask: What is your temperature, how was it measured, and when did it start?     no 5. BREATHING  DIFFICULTY: Are you having any difficulty breathing? (e.g., normal; shortness of breath, wheezing, unable to speak)      Normal  6. BETTER-SAME-WORSE: Are you getting better, staying the same or getting worse compared to yesterday?  If getting worse, ask, In what way?     same 7. OTHER SYMPTOMS: Do you have any other symptoms?  (e.g., chills, fatigue, headache, loss of smell or taste, muscle pain, sore throat)     No  8. COVID-19 DIAGNOSIS: How do you know that you have COVID? (e.g., positive lab test or self-test, diagnosed by doctor or NP/PA, symptoms after exposure).      In the ER  11. HIGH RISK DISEASE: Do you have any chronic medical problems? (e.g., asthma, heart or lung disease, weak immune system, obesity, etc.)       htn  Answer Assessment - Initial Assessment Questions 1. COVID-19 ONSET: When did the symptoms of COVID-19 first start?     3 weeks ago 2. DIAGNOSIS CONFIRMATION: How do you know you have had COVID-19? (e.g., positive lab test or self- test, diagnosed by doctor or NP/PA, symptoms after exposure)     Confirmed in ER on Sunday 3. MAIN SYMPTOM:  What is your main concern or symptom right now? (e.g., breathing difficulty, cough, fatigue. loss of smell)     cough  5. BETTER-SAME-WORSE: Are you getting better, staying  the same, or getting worse over the last 1 to 2 weeks?     Same but cough is intense.  6. BREATHING DIFFICULTY: Are you having any trouble breathing? If Yes, ask: How bad is your breathing? (e.g., mild, moderate, severe)      Only after coughing  8. OTHER SYMPTOMS: Do you have any other symptoms?  (e.g., cough, fatigue, fever, headache, muscle pain, shortness of breath, weakness)     no 9. RECENT MEDICAL VISIT: Have you been seen by a healthcare provider (doctor, NP, PA) for these persisting COVID-19 symptoms? If Yes, ask: When were you seen? (e.g., date)     Er visit on Sunday 9. HIGH RISK DISEASE: Do you have any chronic  medical problems? (e.g., asthma, heart or lung disease, weak immune system, obesity, etc.)     HTN  Protocols used: COVID-19 - Diagnosed or Suspected-A-AH, COVID-19 - Persisting Symptoms Follow-up Call-A-AH

## 2023-12-04 ENCOUNTER — Ambulatory Visit: Admitting: Family Medicine

## 2023-12-04 ENCOUNTER — Encounter: Payer: Self-pay | Admitting: Family Medicine

## 2023-12-04 VITALS — BP 131/60 | HR 79 | Wt 267.7 lb

## 2023-12-04 DIAGNOSIS — J40 Bronchitis, not specified as acute or chronic: Secondary | ICD-10-CM | POA: Diagnosis not present

## 2023-12-04 DIAGNOSIS — R051 Acute cough: Secondary | ICD-10-CM | POA: Diagnosis not present

## 2023-12-04 DIAGNOSIS — R058 Other specified cough: Secondary | ICD-10-CM | POA: Diagnosis not present

## 2023-12-04 MED ORDER — ALBUTEROL SULFATE HFA 108 (90 BASE) MCG/ACT IN AERS: 2.0000 | INHALATION_SPRAY | Freq: Four times a day (QID) | RESPIRATORY_TRACT | 0 refills | Status: AC | PRN

## 2023-12-04 MED ORDER — GUAIFENESIN 200 MG PO TABS: 200.0000 mg | ORAL_TABLET | ORAL | 0 refills | Status: AC | PRN

## 2023-12-04 NOTE — Progress Notes (Signed)
 Acute Office Visit  Introduced to nurse practitioner role and practice setting.  All questions answered.  Discussed provider/patient relationship and expectations.   Subjective:     Patient ID: Lauren Walton, female    DOB: 12/26/1955, 68 y.o.   MRN: [id number]  Chief Complaint  Patient presents with   Follow-up    Patient was seen at the ED 11/30/23 for positive Covid.     Discussed the use of AI scribe software for clinical note transcription with the patient, who gave verbal consent to proceed.  History of Present Illness Lauren Walton is a 68 year old female who presents with persistent cough and congestion following a COVID-19 diagnosis.  She has been experiencing a persistent cough and congestion for the past three weeks. Initially, she had a few days of congestion and was prescribed a Z-Pak and cough tablets on 11/24/23. Despite completing the antibiotics, her symptoms persisted, leading to an emergency room visit where she was diagnosed with COVID-19 on 11/30/23 - she is here for a follow up today.  Her cough is still present, particularly at night, and she experiences some congestion. No fever, chills, chest pain, palpitations, difficulty breathing, nausea, vomiting, diarrhea, or constipation. She occasionally experiences wheezing but not consistently.  Her current medications include benzonatate tablets, taken one or two times a day, and Coricidin cough syrup. She also uses Flonase nasal spray intermittently and has recently completed a steroid pack for a neck issue.  No ear pain, vision changes, or hearing changes. She is able to eat and drink without difficulty. No facial pain or tenderness.  She is planning to return to school next week to pursue her GED classes.  HPI  ROS      Objective:    BP 131/60 (BP Location: Left Arm, Patient Position: Sitting, Cuff Size: Large)   Pulse 79   Wt 267 lb 11.2 oz (121.4 kg)   SpO2 100%   BMI 41.93 kg/m    Physical  Exam Constitutional:      General: She is not in acute distress.    Appearance: She is well-developed and normal weight. She is not ill-appearing, toxic-appearing or diaphoretic.  HENT:     Head: Normocephalic.     Right Ear: Hearing, ear canal and external ear normal. No decreased hearing noted. No drainage, swelling or tenderness. No middle ear effusion. No mastoid tenderness. Tympanic membrane is not erythematous.     Left Ear: Hearing, ear canal and external ear normal. No decreased hearing noted. No drainage, swelling or tenderness.  No middle ear effusion. No mastoid tenderness. Tympanic membrane is not erythematous.     Ears:     Comments: Excessive earwax, unable to visualize TM    Nose: Congestion present. No rhinorrhea.     Right Sinus: No maxillary sinus tenderness or frontal sinus tenderness.     Left Sinus: No maxillary sinus tenderness or frontal sinus tenderness.     Mouth/Throat:     Mouth: Mucous membranes are moist. No oral lesions.     Pharynx: Uvula midline. No pharyngeal swelling, oropharyngeal exudate, posterior oropharyngeal erythema or uvula swelling.     Tonsils: No tonsillar exudate or tonsillar abscesses. 0 on the right. 0 on the left.  Eyes:     Extraocular Movements:     Right eye: Normal extraocular motion.     Left eye: Normal extraocular motion.     Conjunctiva/sclera: Conjunctivae normal.     Pupils: Pupils are equal, round, and reactive  to light.  Cardiovascular:     Rate and Rhythm: Normal rate and regular rhythm.     Heart sounds: No murmur heard.    No gallop.  Pulmonary:     Effort: Pulmonary effort is normal. No respiratory distress.     Breath sounds: Normal breath sounds. No stridor. No wheezing, rhonchi or rales.  Chest:     Chest wall: No tenderness.  Lymphadenopathy:     Cervical: Cervical adenopathy present.  Skin:    General: Skin is warm and dry.     Capillary Refill: Capillary refill takes less than 2 seconds.  Neurological:      Mental Status: She is alert.  Psychiatric:        Mood and Affect: Mood normal.        Behavior: Behavior normal.        Thought Content: Thought content normal.        Judgment: Judgment normal.     No results found for any visits on 12/04/23.      Assessment & Plan:  Assessment and Plan Assessment & Plan Post-viral cough  -associated with congestion Persistent cough and nasal congestion following a post-viral upper respiratory tract infection, covid diagnosed on 11/30/23. Symptoms have persisted for three weeks. Once given zprak on 11/24/23 Completed a course of azithromycin and benzonatate. No fever, chills, chest pain, palpitations, or shortness of breath. No sinus pain. Occasional wheezing noted per patient. Breath sound clear during visit. Lungs and heart sound normal. No facial pain or sinus tenderness. Symptoms are slowly improving- states starting to feel better today. - Continue benzonatate for cough management. - Use Flonase nasal spray twice daily to reduce nasal inflammation. - Use nasal saline spray twice daily, wait 15 minutes before using Flonase. - Prescribed guaifenesin (Mucinex) every four hours to help expel mucus. - Prescribed albuterol inhaler for use if wheezing or cough worsens. - Advised wearing a mask and practicing hand hygiene until cough resolves - can go to school as long as fever free for 24 hours, has not had fever. - increase daily water intake - hot tea with honey, salt water gargles, rest - patient over seems to be improving   Problem List Items Addressed This Visit   None Visit Diagnoses       Productive cough    -  Primary   Relevant Medications   guaiFENesin 200 MG tablet     Bronchitis       Relevant Medications   albuterol (VENTOLIN HFA) 108 (90 Base) MCG/ACT inhaler   guaiFENesin 200 MG tablet     Acute cough       Relevant Medications   albuterol (VENTOLIN HFA) 108 (90 Base) MCG/ACT inhaler       Meds ordered this encounter   Medications   albuterol (VENTOLIN HFA) 108 (90 Base) MCG/ACT inhaler    Sig: Inhale 2 puffs into the lungs every 6 (six) hours as needed for wheezing or shortness of breath.    Dispense:  8 g    Refill:  0   guaiFENesin 200 MG tablet    Sig: Take 1 tablet (200 mg total) by mouth every 4 (four) hours as needed for cough or to loosen phlegm.    Dispense:  30 suppository    Refill:  0    Return if symptoms worsen or fail to improve.  Curtis DELENA Boom, FNP  I, Curtis DELENA Boom, FNP, have reviewed all documentation for this visit. The documentation on 12/04/23 for  the exam, diagnosis, procedures, and orders are all accurate and complete.

## 2023-12-10 ENCOUNTER — Ambulatory Visit

## 2023-12-18 ENCOUNTER — Inpatient Hospital Stay: Admitting: Family Medicine

## 2023-12-25 ENCOUNTER — Ambulatory Visit: Admitting: Podiatry

## 2023-12-25 ENCOUNTER — Encounter: Payer: Self-pay | Admitting: Podiatry

## 2023-12-25 DIAGNOSIS — M79674 Pain in right toe(s): Secondary | ICD-10-CM

## 2023-12-25 DIAGNOSIS — B351 Tinea unguium: Secondary | ICD-10-CM | POA: Diagnosis not present

## 2023-12-25 DIAGNOSIS — M2041 Other hammer toe(s) (acquired), right foot: Secondary | ICD-10-CM | POA: Diagnosis not present

## 2023-12-25 DIAGNOSIS — E1122 Type 2 diabetes mellitus with diabetic chronic kidney disease: Secondary | ICD-10-CM | POA: Diagnosis not present

## 2023-12-25 DIAGNOSIS — M79675 Pain in left toe(s): Secondary | ICD-10-CM

## 2023-12-25 DIAGNOSIS — L853 Xerosis cutis: Secondary | ICD-10-CM

## 2023-12-25 DIAGNOSIS — M2142 Flat foot [pes planus] (acquired), left foot: Secondary | ICD-10-CM

## 2023-12-25 DIAGNOSIS — L84 Corns and callosities: Secondary | ICD-10-CM

## 2023-12-25 DIAGNOSIS — M2141 Flat foot [pes planus] (acquired), right foot: Secondary | ICD-10-CM

## 2023-12-25 DIAGNOSIS — E119 Type 2 diabetes mellitus without complications: Secondary | ICD-10-CM | POA: Diagnosis not present

## 2023-12-25 DIAGNOSIS — M2042 Other hammer toe(s) (acquired), left foot: Secondary | ICD-10-CM

## 2023-12-25 MED ORDER — AMMONIUM LACTATE 12 % EX LOTN: TOPICAL_LOTION | CUTANEOUS | 5 refills | Status: AC

## 2023-12-25 NOTE — Progress Notes (Signed)
 Subjective:  Patient ID: Lauren Walton, female    DOB: Dec 02, 1955,  MRN: [id number]  Keneshia A Piggott presents to clinic today for preventative diabetic foot care and corn(s) of both feet, callus(es) left foot and painful elongated, discolored, dystrophic nails.  Pain interferes with ambulation. Aggravating factors include wearing enclosed shoe gear. Painful toenails interfere with ambulation. Aggravating factors include wearing enclosed shoe gear. Pain is relieved with periodic professional debridement. Painful corns and calluses are aggravated when weightbearing with and without shoegear. Pain is relieved with periodic professional debridement.  Chief Complaint  Patient presents with   Nail Problem    Thick painful toenails, 3 month follow up Medicaid ABN signed today   Diabetes    A1C 6.7   New problem(s): None.   PCP is Wellington Curtis DELENA, FNP.  Allergies  Allergen Reactions   Lisinopril Swelling   Ace Inhibitors Swelling   Acetaminophen      Other Reaction(s): Not available   Enalapril Other (See Comments)   Oxycodone-Acetaminophen  Other (See Comments)    dizziness   Penicillins Other (See Comments)   Oxycodone Other (See Comments)    PERCOCET  oxycodone    Review of Systems: Negative except as noted in the HPI.  Objective: No changes noted in today's physical examination. There were no vitals filed for this visit. Ottie A Mortellaro is a pleasant 68 y.o. female obese in NAD. AAO x 3.  Assessment/Plan: 1. Pain due to onychomycosis of toenails of both feet   2. Corns and callosities   3. Xerosis cutis   4. Acquired hammertoes of both feet   5. Pes planus of both feet   6. Type 2 diabetes mellitus without complication, without long-term current use of insulin (HCC)    Orders Placed This Encounter  Procedures   For home use only DME Other see comment    To [facility]: Dispense one pair extra depth shoes and 3 pair custom insoles. Offload  callus submet head 1 left foot.    Length of Need:   12 Months   Meds ordered this encounter  Medications   ammonium lactate (LAC-HYDRIN) 12 % lotion    Sig: Apply to both feet twice daily. Avoid application between toes.    Dispense:  400 g    Refill:  5  Patient was evaluated and treated. All patient's and/or POA's questions/concerns addressed on today's visit. Toenails 1-5 b/l debrided in length and girth without incident. Corn(s) and Callus(es) dorsal PIPJ of bilateral 5th toes and submet head 1 left foot pared with sharp debridement without incident. Continue soft, supportive shoe gear daily. Report any pedal injuries to medical professional. Call office if there are any questions/concerns. -Rx to be sent to [facility] for one pair diabetic shoes and 3 pair custom insoles. To [facility] [address] [place], [place] [address] Phone: [phone] Fax: [phone]. -For xerosis, Rx sent for Ammonium Lactate Lotion 12%. Apply to feet twice daily avoiding application between toes. -Patient/POA to call should there be question/concern in the interim.   Return in about 3 months (around 03/26/2024).  Delon LITTIE Merlin, DPM      [place] LOCATION: [address]  [place], [place] [address]                   Office [phone]   [facility] LOCATION: [address] [place], [place] [address] Office [phone]

## 2023-12-28 ENCOUNTER — Encounter: Payer: Self-pay | Admitting: Family Medicine

## 2023-12-31 ENCOUNTER — Telehealth: Payer: Self-pay

## 2023-12-31 NOTE — Telephone Encounter (Signed)
 PA request received from CoverMyMeds for Ammonium Lactate  12% lotion. PA submitted and waiting on response.  Nary Padin  (Key: [id number])

## 2024-01-05 ENCOUNTER — Telehealth: Payer: Self-pay

## 2024-01-05 NOTE — Telephone Encounter (Unsigned)
 Copied from CRM #[id number]. Topic: Clinical - Medication Question >> Jan 05, 2024  2:21 PM Selinda RAMAN wrote: Reason for CRM: The patient called stating she was on Simvastatin and she just wants to know if she is supposed to take that along with the Crestor  or just the Crestor  by itself. Please assist patient

## 2024-01-06 NOTE — Telephone Encounter (Signed)
 Patient advised.

## 2024-01-06 NOTE — Telephone Encounter (Signed)
 Please advise her to take crestor , and stop simvastatin

## 2024-01-23 ENCOUNTER — Encounter: Payer: Self-pay | Admitting: Orthopedic Surgery

## 2024-02-02 ENCOUNTER — Inpatient Hospital Stay
Admission: RE | Admit: 2024-02-02 | Discharge: 2024-02-02 | Disposition: A | Payer: Self-pay | Source: Ambulatory Visit | Attending: Orthopedic Surgery | Admitting: Orthopedic Surgery

## 2024-02-02 ENCOUNTER — Other Ambulatory Visit: Payer: Self-pay | Admitting: Family Medicine

## 2024-02-02 DIAGNOSIS — Z049 Encounter for examination and observation for unspecified reason: Secondary | ICD-10-CM

## 2024-02-11 ENCOUNTER — Ambulatory Visit

## 2024-02-12 ENCOUNTER — Telehealth: Payer: Self-pay | Admitting: Family Medicine

## 2024-02-12 DIAGNOSIS — E039 Hypothyroidism, unspecified: Secondary | ICD-10-CM

## 2024-02-12 MED ORDER — ROSUVASTATIN CALCIUM 20 MG PO TABS: 20.0000 mg | ORAL_TABLET | Freq: Every day | ORAL | 1 refills | Status: DC

## 2024-02-12 NOTE — Telephone Encounter (Signed)
 [facility] faxed refill request for the following medications:   rosuvastatin  (CRESTOR ) 20 MG tablet    Please advise.

## 2024-02-17 ENCOUNTER — Ambulatory Visit: Admitting: Family Medicine

## 2024-02-17 ENCOUNTER — Encounter: Payer: Self-pay | Admitting: Family Medicine

## 2024-02-17 VITALS — BP 131/54 | HR 82 | Temp 97.6°F | Ht 67.0 in | Wt 272.8 lb

## 2024-02-17 DIAGNOSIS — I10 Essential (primary) hypertension: Secondary | ICD-10-CM

## 2024-02-17 DIAGNOSIS — E1122 Type 2 diabetes mellitus with diabetic chronic kidney disease: Secondary | ICD-10-CM

## 2024-02-17 DIAGNOSIS — E782 Mixed hyperlipidemia: Secondary | ICD-10-CM

## 2024-02-17 DIAGNOSIS — Z780 Asymptomatic menopausal state: Secondary | ICD-10-CM

## 2024-02-17 DIAGNOSIS — E039 Hypothyroidism, unspecified: Secondary | ICD-10-CM

## 2024-02-17 DIAGNOSIS — K219 Gastro-esophageal reflux disease without esophagitis: Secondary | ICD-10-CM

## 2024-02-17 MED ORDER — METOPROLOL SUCCINATE ER 25 MG PO TB24: 12.5000 mg | ORAL_TABLET | Freq: Every day | ORAL | 1 refills | Status: AC

## 2024-02-17 MED ORDER — OMEPRAZOLE 40 MG PO CPDR: 40.0000 mg | DELAYED_RELEASE_CAPSULE | Freq: Two times a day (BID) | ORAL | 0 refills | Status: AC

## 2024-02-17 MED ORDER — ROSUVASTATIN CALCIUM 20 MG PO TABS: 20.0000 mg | ORAL_TABLET | Freq: Every day | ORAL | 1 refills | Status: AC

## 2024-02-17 MED ORDER — DAPAGLIFLOZIN PROPANEDIOL 10 MG PO TABS: 10.0000 mg | ORAL_TABLET | Freq: Every day | ORAL | 3 refills | Status: AC

## 2024-02-17 MED ORDER — LEVOTHYROXINE SODIUM 150 MCG PO CAPS: 150.0000 ug | ORAL_CAPSULE | Freq: Every day | ORAL | 0 refills | Status: AC

## 2024-02-17 NOTE — Progress Notes (Unsigned)
 "     Established patient visit   Patient: Lauren Walton   DOB: 02-22-1955   69 y.o. Female  MRN: [id number] Visit Date: 02/17/2024  Today's healthcare provider: LAURAINE LOISE BUOY, DO   Chief Complaint  Patient presents with   Transitions Of Care    Patient is here for a 3 month follow up regarding chronic management.    Shingles wants to discuss, states already got the flu vaccine.  [facility] in [place].  Diabetic Eye Exam- [facility] less than a year, sent for results   Subjective    HPI Lauren Walton is a 69 year old female who presents for a follow-up visit to discuss the shingles vaccine and other health concerns.  She is concerned about the shingles vaccine, having heard that it might cause shingles to appear. She is unsure if her insurance will cover the vaccine.  She has a history of bone spurs and is considering surgery to decompress her spine. [facility]. She mentioned a previous bone density test about ten years ago, which she did not attend, and a normal bone density score from 2020.  She is currently taking metoprolol , rosuvastatin , levothyroxine , and omeprazole . She experiences constipation, which worsened with a higher dose of vitamins, currently taking 2000 IU. She inquires about the use of Miralax for constipation management.  She reports being told her calcium  was a little high and her thyroid level was up at a previous visit. She does not take calcium  or iron supplements but has been advised to increase dietary iron intake due to low hemoglobin levels.  She has been experiencing acid reflux and takes omeprazole  twice daily. She has a history of stomach ulcers and was advised against taking NSAIDs.  She is managing her diabetes with metformin, which was previously reduced due to gastrointestinal side effects. She is interested in increasing the dose again as she felt it helped with weight loss. She  has been trying to increase her physical activity by walking.  No dizziness or lightheadedness. Reports constipation and acid reflux.   ***  {History (Optional):23778}  Medications: Show/hide medication list[1]   {Insert previous labs (optional):23779} {See past labs  Heme  Chem  Endocrine  Serology  Results Review (optional):1}   Objective    BP (!) 131/54 (BP Location: Left Arm, Patient Position: Sitting, Cuff Size: Large)   Pulse 82   Temp 97.6 F (36.4 C) (Oral)   Ht 5' 7 (1.702 m)   Wt 272 lb 12.8 oz (123.7 kg)   SpO2 100%   BMI 42.73 kg/m  {Insert last BP/Wt (optional):23777}{See vitals history (optional):1}   Physical Exam Vitals and nursing note reviewed.  Constitutional:      General: She is not in acute distress.    Appearance: Normal appearance.  HENT:     Head: Normocephalic and atraumatic.  Eyes:     General: No scleral icterus.    Conjunctiva/sclera: Conjunctivae normal.  Cardiovascular:     Rate and Rhythm: Normal rate.  Pulmonary:     Effort: Pulmonary effort is normal.  Neurological:     Mental Status: She is alert and oriented to person, place, and time. Mental status is at baseline.  Psychiatric:        Mood and Affect: Mood normal.        Behavior: Behavior normal.      No results found for any visits on 02/17/24.  Assessment &  Plan    Encounter for osteoporosis screening in asymptomatic postmenopausal patient -     DG Bone Density; Future  Acquired hypothyroidism -     Levothyroxine  Sodium; Take 1 capsule (150 mcg total) by mouth daily before breakfast.  Dispense: 90 capsule; Refill: 0 -     TSH Rfx on Abnormal to Free T4  Primary hypertension -     Metoprolol  Succinate ER; Take 0.5 tablets (12.5 mg total) by mouth daily.  Dispense: 45 tablet; Refill: 1  Hyperlipemia, mixed -     Rosuvastatin  Calcium ; Take 1 tablet (20 mg total) by mouth daily.  Dispense: 90 tablet; Refill: 1  Gastroesophageal reflux disease, unspecified  whether esophagitis present -     Omeprazole ; Take 1 capsule (40 mg total) by mouth in the morning and at bedtime.  Dispense: 180 capsule; Refill: 0 -     Ambulatory referral to Gastroenterology -     H. pylori breath test  Hypercalcemia -     Basic metabolic panel with GFR  Type 2 diabetes mellitus with stage 2 chronic kidney disease, without long-term current use of insulin (HCC) -     Dapagliflozin  Propanediol; Take 1 tablet (10 mg total) by mouth daily.  Dispense: 90 tablet; Refill: 3      Type 2 diabetes mellitus with stage 2 chronic kidney disease A1c at 6.7 indicates good control. Discussed dapagliflozin  for glycemic control and renal protection, with potential side effects. - Continue metformin at current dose (two pills daily). - Added dapagliflozin  (Farxiga ) for additional glycemic control and renal protection. - Ordered H. pylori breath test. - Referred to gastroenterology for further evaluation of acid reflux and potential ulcers.  Primary hypertension No recent dizziness or lightheadedness.  Mixed hyperlipidemia Cholesterol panel deferred due to recent medication adjustment. - Deferred cholesterol panel for now.  Acquired hypothyroidism Recent thyroid level slightly elevated. Discussed increasing levothyroxine  if levels remain elevated. - Ordered thyroid function tests. - Will consider increasing levothyroxine  dose if thyroid levels remain elevated.  Gastroesophageal reflux disease Current management with omeprazole  twice daily. Previous endoscopy showed ulcers. Discussed gastroenterology referral due to inadequate control. - Continue omeprazole  twice daily for 90 days. - Referred to gastroenterology for further evaluation.  Hypercalcemia Mild hypercalcemia noted. Vitamin D  slightly low at 24. - Ordered basic metabolic panel to recheck calcium  levels.  General health maintenance Discussed shingles vaccine, flu shot, COVID booster, and bone health. Bone density  test overdue. - Recommended shingles vaccine at pharmacy. - Recommended COVID booster at pharmacy. - Reordered bone density test. - Encouraged increased intake of dark leafy greens and red meat for iron. - Recommended Miralax daily for constipation management. ***  Return in about 3 months (around 05/10/2024) for DM, HTN, and in 6 months w/next provider.      I discussed the assessment and treatment plan with the patient  The patient was provided an opportunity to ask questions and all were answered. The patient agreed with the plan and demonstrated an understanding of the instructions.   The patient was advised to call back or seek an in-person evaluation if the symptoms worsen or if the condition fails to improve as anticipated.    LAURAINE LOISE BUOY, DO  [facility] [phone] (phone) [phone] (fax)  [facility]    [1]  Outpatient Medications Prior to Visit  Medication Sig   acetaminophen  (TYLENOL ) 650 MG CR tablet Take 650 mg by mouth every 8 (eight) hours as needed for pain.  albuterol  (VENTOLIN  HFA) 108 (90 Base) MCG/ACT inhaler Inhale 2 puffs into the lungs every 6 (six) hours as needed for wheezing or shortness of breath.   ammonium lactate  (LAC-HYDRIN ) 12 % lotion Apply to both feet twice daily. Avoid application between toes.   aspirin EC 81 MG tablet Take 81 mg by mouth daily.   cetirizine (ZYRTEC) 10 MG tablet Take 1 tablet by mouth daily.   cholecalciferol (VITAMIN D3) 25 MCG (1000 UNIT) tablet Take 1,000 Units by mouth daily.   fluticasone (FLONASE) 50 MCG/ACT nasal spray Place into both nostrils daily.   guaiFENesin  200 MG tablet Take 1 tablet (200 mg total) by mouth every 4 (four) hours as needed for cough or to loosen phlegm.   hydrochlorothiazide (HYDRODIURIL) 25 MG tablet Take 25 mg by mouth daily.   losartan (COZAAR) 25 MG tablet Take 25 mg by mouth daily.   meclizine  (ANTIVERT ) 12.5 MG tablet Take 1 tablet (12.5 mg  total) by mouth 3 (three) times daily as needed for dizziness.   metFORMIN (GLUCOPHAGE) 500 MG tablet Take by mouth 2 (two) times daily with a meal.   tiZANidine (ZANAFLEX) 4 MG tablet every 6 (six) hours as needed for muscle spasms.   [DISCONTINUED] Levothyroxine  Sodium 150 MCG CAPS Take 150 mcg by mouth daily before breakfast.    [DISCONTINUED] metoprolol  succinate (TOPROL -XL) 25 MG 24 hr tablet Take 12.5 mg by mouth daily.   [DISCONTINUED] omeprazole  (PRILOSEC) 40 MG capsule Take by mouth.   [DISCONTINUED] rosuvastatin  (CRESTOR ) 20 MG tablet Take 1 tablet (20 mg total) by mouth daily.   No facility-administered medications prior to visit.   "

## 2024-02-17 NOTE — Patient Instructions (Signed)
 Recommended vaccines to get at pharmacy: Shingrix (shingles).

## 2024-02-18 ENCOUNTER — Encounter: Payer: Self-pay | Admitting: Family Medicine

## 2024-02-18 LAB — TSH RFX ON ABNORMAL TO FREE T4: TSH: 1.63 u[IU]/mL (ref 0.450–4.500)

## 2024-02-18 LAB — BASIC METABOLIC PANEL WITH GFR
BUN/Creatinine Ratio: 29 — ABNORMAL HIGH (ref 12–28)
BUN: 28 mg/dL — ABNORMAL HIGH (ref 8–27)
CO2: 23 mmol/L (ref 20–29)
Calcium: 10.5 mg/dL — ABNORMAL HIGH (ref 8.7–10.3)
Chloride: 102 mmol/L (ref 96–106)
Creatinine, Ser: 0.97 mg/dL (ref 0.57–1.00)
Glucose: 170 mg/dL — ABNORMAL HIGH (ref 70–99)
Potassium: 4.6 mmol/L (ref 3.5–5.2)
Sodium: 141 mmol/L (ref 134–144)
eGFR: 63 mL/min/1.73

## 2024-02-18 LAB — H. PYLORI BREATH TEST: H pylori Breath Test: NEGATIVE

## 2024-02-20 NOTE — Progress Notes (Addendum)
 "  Referring Physician:  No referring provider defined for this encounter.  Primary Physician:  Wellington Curtis LABOR, FNP (Inactive)  History of Present Illness: 02/25/2024 [name] has a history of HTN, DM, hypothyroidism, thrombocytopenia, hyperlipidemia, obesity, GERD, osteoporosis.   Dr. Dow ([place]) saw her on 01/26/24 and recommended ACDF C3-C4 for severe cervical stenosis and myelopathy. She is here for a second opinion.   She has constant neck pain with intermittent left shoulder/upper arm pain, but not down the arm x 1 year. She has intermittent numbness/tingling in left hand but no pain. No right sided arm pain. She doesn't have much pain during the day, it is worse at night. She is dropping things with left hand. She can do buttons/zippers. No balance issues.   Her last HgbA1c on 11/18/23 was 6.7.   Tobacco use: Does not smoke.   Bowel/Bladder Dysfunction: none  Conservative measures:  Physical therapy: has not participated in Multimodal medical therapy including regular antiinflammatories: Tylenol , Medrol Pak, Tizanidine, Prednisone, Flexeril , Mobic, Lidocaine patches, Ibuprofen  Injections: no epidural steroid injections  Past Surgery: no spine surgery  Lauren Walton is dropping things with left hand. No dexterity or balance issues.   The symptoms are causing a significant impact on the patient's life.   Review of Systems:  A 10 point review of systems is negative, except for the pertinent positives and negatives detailed in the HPI.  Past Medical History: Past Medical History:  Diagnosis Date   Allergy    Arthritis    Diabetes mellitus without complication (HCC)    GERD (gastroesophageal reflux disease)    Hypertension    Osteoporosis    Thyroid  disease     Past Surgical History: Past Surgical History:  Procedure Laterality Date   ABDOMINAL HYSTERECTOMY     CHOLECYSTECTOMY     PARATHYROIDECTOMY  1998    Allergies: Allergies as of  02/25/2024 - Review Complete 02/25/2024  Allergen Reaction Noted   Lisinopril Swelling 09/08/2013   Ace inhibitors Swelling 09/09/2014   Acetaminophen   05/09/2020   Enalapril Other (See Comments) 11/18/2023   Oxycodone-acetaminophen  Other (See Comments) 05/26/2016   Penicillins Other (See Comments) 08/17/2018   Oxycodone Other (See Comments) 01/05/2016    Medications: Outpatient Encounter Medications as of 02/25/2024  Medication Sig   acetaminophen  (TYLENOL ) 650 MG CR tablet Take 650 mg by mouth every 8 (eight) hours as needed for pain.   albuterol  (VENTOLIN  HFA) 108 (90 Base) MCG/ACT inhaler Inhale 2 puffs into the lungs every 6 (six) hours as needed for wheezing or shortness of breath.   ammonium lactate  (LAC-HYDRIN ) 12 % lotion Apply to both feet twice daily. Avoid application between toes.   aspirin EC 81 MG tablet Take 81 mg by mouth daily.   cetirizine (ZYRTEC) 10 MG tablet Take 1 tablet by mouth daily.   cholecalciferol (VITAMIN D3) 25 MCG (1000 UNIT) tablet Take 1,000 Units by mouth daily.   dapagliflozin  propanediol (FARXIGA ) 10 MG TABS tablet Take 1 tablet (10 mg total) by mouth daily.   fluticasone (FLONASE) 50 MCG/ACT nasal spray Place into both nostrils daily.   guaiFENesin  200 MG tablet Take 1 tablet (200 mg total) by mouth every 4 (four) hours as needed for cough or to loosen phlegm.   hydrochlorothiazide (HYDRODIURIL) 25 MG tablet Take 25 mg by mouth daily.   Levothyroxine  Sodium 150 MCG CAPS Take 1 capsule (150 mcg total) by mouth daily before breakfast.   losartan (COZAAR) 25 MG tablet Take 25 mg by mouth  daily.   meclizine  (ANTIVERT ) 12.5 MG tablet Take 1 tablet (12.5 mg total) by mouth 3 (three) times daily as needed for dizziness.   metFORMIN (GLUCOPHAGE) 500 MG tablet Take by mouth 2 (two) times daily with a meal.   metoprolol  succinate (TOPROL -XL) 25 MG 24 hr tablet Take 0.5 tablets (12.5 mg total) by mouth daily.   omeprazole  (PRILOSEC) 40 MG capsule Take 1 capsule  (40 mg total) by mouth in the morning and at bedtime.   rosuvastatin  (CRESTOR ) 20 MG tablet Take 1 tablet (20 mg total) by mouth daily.   [DISCONTINUED] tiZANidine (ZANAFLEX) 4 MG tablet every 6 (six) hours as needed for muscle spasms.   No facility-administered encounter medications on file as of 02/25/2024.    Social History: Social History[1]  Family Medical History: Family History  Problem Relation Age of Onset   Diabetes Mother    Heart disease Mother    Arthritis Mother    Colon cancer Father    Arthritis Sister    Colon cancer Brother    Lung cancer Brother    Stomach cancer Brother    Healthy Brother    Breast cancer Cousin    Lupus Son    Healthy Son    Learning disabilities Daughter     Physical Examination: Vitals:   02/25/24 1116  BP: 136/76    General: Patient is well developed, well nourished, calm, collected, and in no apparent distress. Attention to examination is appropriate.  Respiratory: Patient is breathing without any difficulty.   NEUROLOGICAL:     Awake, alert, oriented to person, place, and time.  Speech is clear and fluent. Fund of knowledge is appropriate.   Cranial Nerves: Pupils equal round and reactive to light.  Facial tone is symmetric.    No posterior cervical tenderness. No tenderness in bilateral trapezial region.   She has good ROM of both shoulders.   No abnormal lesions on exposed skin.   Strength: Side Biceps Triceps Deltoid Interossei Grip Wrist Ext. Wrist Flex.  R 5 5 5 5 5 5 5   L 5 5 5 5 5 5 5    Side Iliopsoas Quads Hamstring PF DF EHL  R 5 5 5 5 5 5   L 5 5 5 5 5 5    Reflexes are 1+ and symmetric at the biceps, brachioradialis, patella and achilles.   Hoffman's is absent.  Clonus is not present.   Bilateral upper and lower extremity sensation is intact to light touch.     No pain with IR/ER of both hips.   Gait is normal.    Medical Decision Making  Imaging: Cervical MRI dated 01/09/24:     I have  personally reviewed the images and agree with the above interpretation.  Assessment and Plan: Ms. Detwiler has 1 year history of constant neck pain with intermittent left shoulder/upper arm pain. No pain in left or right arm. She has numbness/tingling in left hand but no pain. She is dropping things with left hand.   She has known severe central stenosis C3-C4, moderate central stenosis C4-C7, and multilevel foraminal stenosis.   She is dropping things with left hand. No dexterity or balance issues. No signs of cervical myelopathy on exam.   Treatment options discussed with patient and following plan made:   - We reviewed MRI findings and discussed cervical stenosis at length.  - Recommend she follow up with Dr. Clois to discuss further options including possible surgery.  - Cervical xrays with flex/ext on her way  out. Will message her with results.  - Reviewed above with her daughter as well.   I spent a total of 45 minutes in face-to-face and non-face-to-face activities related to this patient's care today including review of outside records, review of imaging, review of symptoms, physical exam, discussion of differential diagnosis, discussion of treatment options, and documentation.   Thank you for involving me in the care of this patient.   ADDENDUM 03/02/24:  Cervical xrays dated 02/25/24:  FINDINGS:   BONES: 7 cervical segments are well visualized. Vertebral body height is well maintained. Mild increased anterolisthesis of C4 on C5 is noted on flexion. This measures approximately 5 mm. No significant anterolisthesis in neutral positioning is noted.   DISCS AND DEGENERATIVE CHANGES: Osteophytic changes are noted from C4 to C7. Multilevel facet hypertrophic changes are seen.   SOFT TISSUES: No prevertebral soft tissue swelling. The visualized lungs appear clear.   IMPRESSION: 1. Mild increased anterolisthesis of C4 on C5 with flexion, measuring approximately 5 mm, with no  significant anterolisthesis in neutral positioning. 2. Osteophytic changes from C4 to C7 and multilevel facet hypertrophic changes.   Electronically signed by: Oneil Devonshire MD 02/29/2024 07:21 PM EST RP Workstation: [email or web address] Calender briefly discussed with Dr. Clois. He will review with her at her scheduled follow up.   Glade Boys PA-C Dept. of Neurosurgery      [1]  Social History Tobacco Use   Smoking status: Never   Smokeless tobacco: Never  Vaping Use   Vaping status: Never Used  Substance Use Topics   Alcohol use: No   Drug use: No   "

## 2024-02-25 ENCOUNTER — Ambulatory Visit: Admitting: Orthopedic Surgery

## 2024-02-25 ENCOUNTER — Ambulatory Visit

## 2024-02-25 ENCOUNTER — Encounter: Payer: Self-pay | Admitting: Orthopedic Surgery

## 2024-02-25 VITALS — BP 136/76 | Ht 67.0 in | Wt 262.0 lb

## 2024-02-25 DIAGNOSIS — M4802 Spinal stenosis, cervical region: Secondary | ICD-10-CM

## 2024-02-25 DIAGNOSIS — M5412 Radiculopathy, cervical region: Secondary | ICD-10-CM

## 2024-02-25 DIAGNOSIS — M47812 Spondylosis without myelopathy or radiculopathy, cervical region: Secondary | ICD-10-CM

## 2024-02-25 DIAGNOSIS — M4722 Other spondylosis with radiculopathy, cervical region: Secondary | ICD-10-CM | POA: Diagnosis not present

## 2024-02-25 NOTE — Patient Instructions (Signed)
 It was so nice to see you today. Thank you so much for coming in.    You have stenosis (pressure on the spinal cord) at C3-C4. I want you to see Dr.  Clois to discuss possible surgery options for this.   We did xrays today and he will review them at your follow up.   Please do not hesitate to call if you have any questions or concerns. You can also message me in MyChart.   Glade Boys PA-C [phone]     The physicians and staff at [facility] are committed to providing excellent care. You may receive a survey asking for feedback about your experience at our office. We value you your feedback and appreciate you taking the time to to fill it out. The [facility] leadership team is also available to discuss your experience in person, feel free to contact us  [phone].

## 2024-03-08 ENCOUNTER — Ambulatory Visit: Payer: Self-pay | Admitting: Family Medicine

## 2024-03-08 DIAGNOSIS — E559 Vitamin D deficiency, unspecified: Secondary | ICD-10-CM

## 2024-03-08 MED ORDER — VITAMIN D (ERGOCALCIFEROL) 1.25 MG (50000 UNIT) PO CAPS: 50000.0000 [IU] | ORAL_CAPSULE | ORAL | 0 refills | Status: AC

## 2024-03-11 NOTE — Progress Notes (Unsigned)
 "   Referring Physician:  No referring provider defined for this encounter.  Primary Physician:  Wellington Curtis LABOR, FNP (Inactive)  History of Present Illness: 03/11/2024 [name] is here today with a chief complaint of ***  Neck pain with intermittent left shoulder and upper arm pain, does not go down the arm. She has been dropping things. No balance issues.  NO PT due to stenosis in her neck.   Lauren Walton has ***no symptoms of cervical myelopathy.  The symptoms are causing a significant impact on the patient's life.   I have utilized the care everywhere function in epic to review the outside records available from external health systems.  Progress Note from [facility], [place] on 02/25/24:  History of Present Illness: 02/25/2024 [name] has a history of HTN, DM, hypothyroidism, thrombocytopenia, hyperlipidemia, obesity, GERD, osteoporosis.    Dr. Dow ([facility]) saw her on 01/26/24 and recommended ACDF C3-C4 for severe cervical stenosis and myelopathy. She is here for a second opinion.    She has constant neck pain with intermittent left shoulder/upper arm pain, but not down the arm x 1 year. She has intermittent numbness/tingling in left hand but no pain. No right sided arm pain. She doesn't have much pain during the day, it is worse at night. She is dropping things with left hand. She can do buttons/zippers. No balance issues.    Her last HgbA1c on 11/18/23 was 6.7.    Tobacco use: Does not smoke.    Bowel/Bladder Dysfunction: none   Conservative measures:  Physical therapy: has not participated in Multimodal medical therapy including regular antiinflammatories: Tylenol , Medrol Pak, Tizanidine, Prednisone, Flexeril , Mobic, Lidocaine patches, Ibuprofen  Injections: no epidural steroid injections   Past Surgery: no spine surgery  Review of Systems:  A 10 point review of systems is negative, except for the pertinent positives and negatives detailed in the  HPI.  Past Medical History: Past Medical History:  Diagnosis Date   Allergy    Arthritis    Diabetes mellitus without complication (HCC)    GERD (gastroesophageal reflux disease)    Hypertension    Osteoporosis    Thyroid  disease     Past Surgical History: Past Surgical History:  Procedure Laterality Date   ABDOMINAL HYSTERECTOMY     CHOLECYSTECTOMY     PARATHYROIDECTOMY  1998    Allergies: Allergies as of 03/16/2024 - Review Complete 02/25/2024  Allergen Reaction Noted   Lisinopril Swelling 09/08/2013   Ace inhibitors Swelling 09/09/2014   Acetaminophen   05/09/2020   Enalapril Other (See Comments) 11/18/2023   Oxycodone-acetaminophen  Other (See Comments) 05/26/2016   Penicillins Other (See Comments) 08/17/2018   Oxycodone Other (See Comments) 01/05/2016    Medications: Current Medications[1]  Social History: Social History[2]  Family Medical History: Family History  Problem Relation Age of Onset   Diabetes Mother    Heart disease Mother    Arthritis Mother    Colon cancer Father    Arthritis Sister    Colon cancer Brother    Lung cancer Brother    Stomach cancer Brother    Healthy Brother    Breast cancer Cousin    Lupus Son    Healthy Son    Learning disabilities Daughter     Physical Examination: There were no vitals filed for this visit.  General: Patient is in no apparent distress. Attention to examination is appropriate.  Neck:   Supple.  Full range of motion.  Respiratory: Patient is breathing without any  difficulty.   NEUROLOGICAL:     Awake, alert, oriented to person, place, and time.  Speech is clear and fluent.   Cranial Nerves: Pupils equal round and reactive to light.  Facial tone is symmetric.  Facial sensation is symmetric. Shoulder shrug is symmetric. Tongue protrusion is midline.  There is no pronator drift.  Strength: Side Biceps Triceps Deltoid Interossei Grip Wrist Ext. Wrist Flex.  R 5 5 5 5 5 5 5   L 5 5 5 5 5 5 5     Side Iliopsoas Quads Hamstring PF DF EHL  R 5 5 5 5 5 5   L 5 5 5 5 5 5    Reflexes are ***2+ and symmetric at the biceps, triceps, brachioradialis, patella and achilles.   Hoffman's is absent.   Bilateral upper and lower extremity sensation is intact to light touch.    No evidence of dysmetria noted.  Gait is normal.     Medical Decision Making  Imaging: ***  I have personally reviewed the images and agree with the above interpretation.  Assessment and Plan: Lauren Walton is a pleasant 69 y.o. female with ***      Thank you for involving me in the care of this patient.      Chester K. Clois MD, [facility]     [1]  Current Outpatient Medications:    acetaminophen  (TYLENOL ) 650 MG CR tablet, Take 650 mg by mouth every 8 (eight) hours as needed for pain., Disp: , Rfl:    albuterol  (VENTOLIN  HFA) 108 (90 Base) MCG/ACT inhaler, Inhale 2 puffs into the lungs every 6 (six) hours as needed for wheezing or shortness of breath., Disp: 8 g, Rfl: 0   ammonium lactate  (LAC-HYDRIN ) 12 % lotion, Apply to both feet twice daily. Avoid application between toes., Disp: 400 g, Rfl: 5   aspirin EC 81 MG tablet, Take 81 mg by mouth daily., Disp: , Rfl:    cetirizine (ZYRTEC) 10 MG tablet, Take 1 tablet by mouth daily., Disp: , Rfl:    cholecalciferol (VITAMIN D3) 25 MCG (1000 UNIT) tablet, Take 1,000 Units by mouth daily., Disp: , Rfl:    dapagliflozin  propanediol (FARXIGA ) 10 MG TABS tablet, Take 1 tablet (10 mg total) by mouth daily., Disp: 90 tablet, Rfl: 3   fluticasone (FLONASE) 50 MCG/ACT nasal spray, Place into both nostrils daily., Disp: , Rfl:    guaiFENesin  200 MG tablet, Take 1 tablet (200 mg total) by mouth every 4 (four) hours as needed for cough or to loosen phlegm., Disp: 30 suppository, Rfl: 0   hydrochlorothiazide (HYDRODIURIL) 25 MG tablet, Take 25 mg by mouth daily., Disp: , Rfl:    Levothyroxine  Sodium 150 MCG CAPS, Take 1 capsule (150 mcg total) by mouth  daily before breakfast., Disp: 90 capsule, Rfl: 0   losartan (COZAAR) 25 MG tablet, Take 25 mg by mouth daily., Disp: , Rfl:    meclizine  (ANTIVERT ) 12.5 MG tablet, Take 1 tablet (12.5 mg total) by mouth 3 (three) times daily as needed for dizziness., Disp: 30 tablet, Rfl: 0   metFORMIN (GLUCOPHAGE) 500 MG tablet, Take by mouth 2 (two) times daily with a meal., Disp: , Rfl:    metoprolol  succinate (TOPROL -XL) 25 MG 24 hr tablet, Take 0.5 tablets (12.5 mg total) by mouth daily., Disp: 45 tablet, Rfl: 1   omeprazole  (PRILOSEC) 40 MG capsule, Take 1 capsule (40 mg total) by mouth in the morning and at bedtime., Disp: 180 capsule, Rfl: 0   rosuvastatin  (CRESTOR ) 20  MG tablet, Take 1 tablet (20 mg total) by mouth daily., Disp: 90 tablet, Rfl: 1   Vitamin D , Ergocalciferol , (DRISDOL ) 1.25 MG (50000 UNIT) CAPS capsule, Take 1 capsule (50,000 Units total) by mouth every 7 (seven) days., Disp: 12 capsule, Rfl: 0 [2]  Social History Tobacco Use   Smoking status: Never   Smokeless tobacco: Never  Vaping Use   Vaping status: Never Used  Substance Use Topics   Alcohol use: No   Drug use: No   "

## 2024-03-15 ENCOUNTER — Other Ambulatory Visit

## 2024-03-16 ENCOUNTER — Ambulatory Visit: Admitting: Neurosurgery

## 2024-03-23 ENCOUNTER — Ambulatory Visit

## 2024-03-26 ENCOUNTER — Ambulatory Visit: Admitting: Podiatry

## 2024-03-29 ENCOUNTER — Ambulatory Visit

## 2024-05-03 ENCOUNTER — Ambulatory Visit: Admitting: Podiatry

## 2024-05-10 ENCOUNTER — Ambulatory Visit: Admitting: Family Medicine

## 2024-08-17 ENCOUNTER — Ambulatory Visit: Admitting: Family Medicine
